# Patient Record
Sex: Female | Born: 1980 | Race: Black or African American | Hispanic: No | Marital: Single | Smoking: Never smoker
Health system: Southern US, Community
[De-identification: ages and names within clinical notes are randomized; demographics above are authoritative.]

## PROBLEM LIST (undated history)

## (undated) DIAGNOSIS — F32A Depression, unspecified: Secondary | ICD-10-CM

## (undated) DIAGNOSIS — F329 Major depressive disorder, single episode, unspecified: Secondary | ICD-10-CM

## (undated) DIAGNOSIS — F4310 Post-traumatic stress disorder, unspecified: Secondary | ICD-10-CM

## (undated) DIAGNOSIS — F419 Anxiety disorder, unspecified: Secondary | ICD-10-CM

## (undated) DIAGNOSIS — R569 Unspecified convulsions: Secondary | ICD-10-CM

## (undated) HISTORY — PX: MOUTH SURGERY: SHX715

---

## 2004-02-26 ENCOUNTER — Other Ambulatory Visit: Admission: RE | Admit: 2004-02-26 | Discharge: 2004-02-26 | Payer: Self-pay | Admitting: Family Medicine

## 2005-03-07 ENCOUNTER — Other Ambulatory Visit: Admission: RE | Admit: 2005-03-07 | Discharge: 2005-03-07 | Payer: Self-pay | Admitting: Family Medicine

## 2005-03-11 ENCOUNTER — Emergency Department (HOSPITAL_COMMUNITY): Admission: EM | Admit: 2005-03-11 | Discharge: 2005-03-11 | Payer: Self-pay | Admitting: Emergency Medicine

## 2005-03-23 ENCOUNTER — Emergency Department (HOSPITAL_COMMUNITY): Admission: EM | Admit: 2005-03-23 | Discharge: 2005-03-23 | Payer: Self-pay | Admitting: Emergency Medicine

## 2006-01-24 ENCOUNTER — Emergency Department (HOSPITAL_COMMUNITY): Admission: EM | Admit: 2006-01-24 | Discharge: 2006-01-24 | Payer: Self-pay | Admitting: Emergency Medicine

## 2006-01-29 ENCOUNTER — Emergency Department (HOSPITAL_COMMUNITY): Admission: EM | Admit: 2006-01-29 | Discharge: 2006-01-29 | Payer: Self-pay | Admitting: Emergency Medicine

## 2006-03-16 ENCOUNTER — Encounter: Payer: Self-pay | Admitting: Family Medicine

## 2006-03-16 ENCOUNTER — Other Ambulatory Visit: Admission: RE | Admit: 2006-03-16 | Discharge: 2006-03-16 | Payer: Self-pay | Admitting: Family Medicine

## 2006-03-27 ENCOUNTER — Emergency Department (HOSPITAL_COMMUNITY): Admission: EM | Admit: 2006-03-27 | Discharge: 2006-03-27 | Payer: Self-pay | Admitting: Emergency Medicine

## 2006-03-31 ENCOUNTER — Emergency Department (HOSPITAL_COMMUNITY): Admission: EM | Admit: 2006-03-31 | Discharge: 2006-03-31 | Payer: Self-pay | Admitting: Emergency Medicine

## 2006-05-07 ENCOUNTER — Ambulatory Visit: Payer: Self-pay | Admitting: Psychiatry

## 2006-05-07 ENCOUNTER — Emergency Department (HOSPITAL_COMMUNITY): Admission: EM | Admit: 2006-05-07 | Discharge: 2006-05-07 | Payer: Self-pay | Admitting: Emergency Medicine

## 2006-05-07 ENCOUNTER — Inpatient Hospital Stay (HOSPITAL_COMMUNITY): Admission: AD | Admit: 2006-05-07 | Discharge: 2006-05-10 | Payer: Self-pay | Admitting: Psychiatry

## 2006-05-11 ENCOUNTER — Encounter: Payer: Self-pay | Admitting: Family Medicine

## 2006-05-11 ENCOUNTER — Ambulatory Visit: Payer: Self-pay | Admitting: Family Medicine

## 2006-05-11 DIAGNOSIS — F319 Bipolar disorder, unspecified: Secondary | ICD-10-CM | POA: Insufficient documentation

## 2006-05-11 DIAGNOSIS — F1210 Cannabis abuse, uncomplicated: Secondary | ICD-10-CM | POA: Insufficient documentation

## 2006-05-11 DIAGNOSIS — G4700 Insomnia, unspecified: Secondary | ICD-10-CM

## 2006-05-11 DIAGNOSIS — F329 Major depressive disorder, single episode, unspecified: Secondary | ICD-10-CM | POA: Insufficient documentation

## 2006-05-11 DIAGNOSIS — G40309 Generalized idiopathic epilepsy and epileptic syndromes, not intractable, without status epilepticus: Secondary | ICD-10-CM | POA: Insufficient documentation

## 2006-05-31 ENCOUNTER — Encounter: Payer: Self-pay | Admitting: Family Medicine

## 2006-05-31 DIAGNOSIS — F1320 Sedative, hypnotic or anxiolytic dependence, uncomplicated: Secondary | ICD-10-CM | POA: Insufficient documentation

## 2006-06-01 ENCOUNTER — Encounter: Payer: Self-pay | Admitting: Family Medicine

## 2006-07-16 ENCOUNTER — Telehealth: Payer: Self-pay | Admitting: Family Medicine

## 2006-07-20 ENCOUNTER — Telehealth: Payer: Self-pay | Admitting: Family Medicine

## 2006-07-25 ENCOUNTER — Ambulatory Visit: Payer: Self-pay | Admitting: Family Medicine

## 2007-10-15 ENCOUNTER — Emergency Department (HOSPITAL_COMMUNITY): Admission: EM | Admit: 2007-10-15 | Discharge: 2007-10-15 | Payer: Self-pay | Admitting: Emergency Medicine

## 2007-10-18 ENCOUNTER — Telehealth: Payer: Self-pay | Admitting: Internal Medicine

## 2007-10-18 ENCOUNTER — Ambulatory Visit (HOSPITAL_COMMUNITY): Admission: RE | Admit: 2007-10-18 | Discharge: 2007-10-18 | Payer: Self-pay | Admitting: Emergency Medicine

## 2007-10-21 ENCOUNTER — Encounter (INDEPENDENT_AMBULATORY_CARE_PROVIDER_SITE_OTHER): Payer: Self-pay

## 2008-02-27 IMAGING — CR DG HAND COMPLETE 3+V*R*
3 series · 3 of 3 positions shown · non-contrast
Comparison: none

CLINICAL DATA: Hand injury and pain.  
 RIGHT HAND ? 3 VIEW:

[x hand pa right]
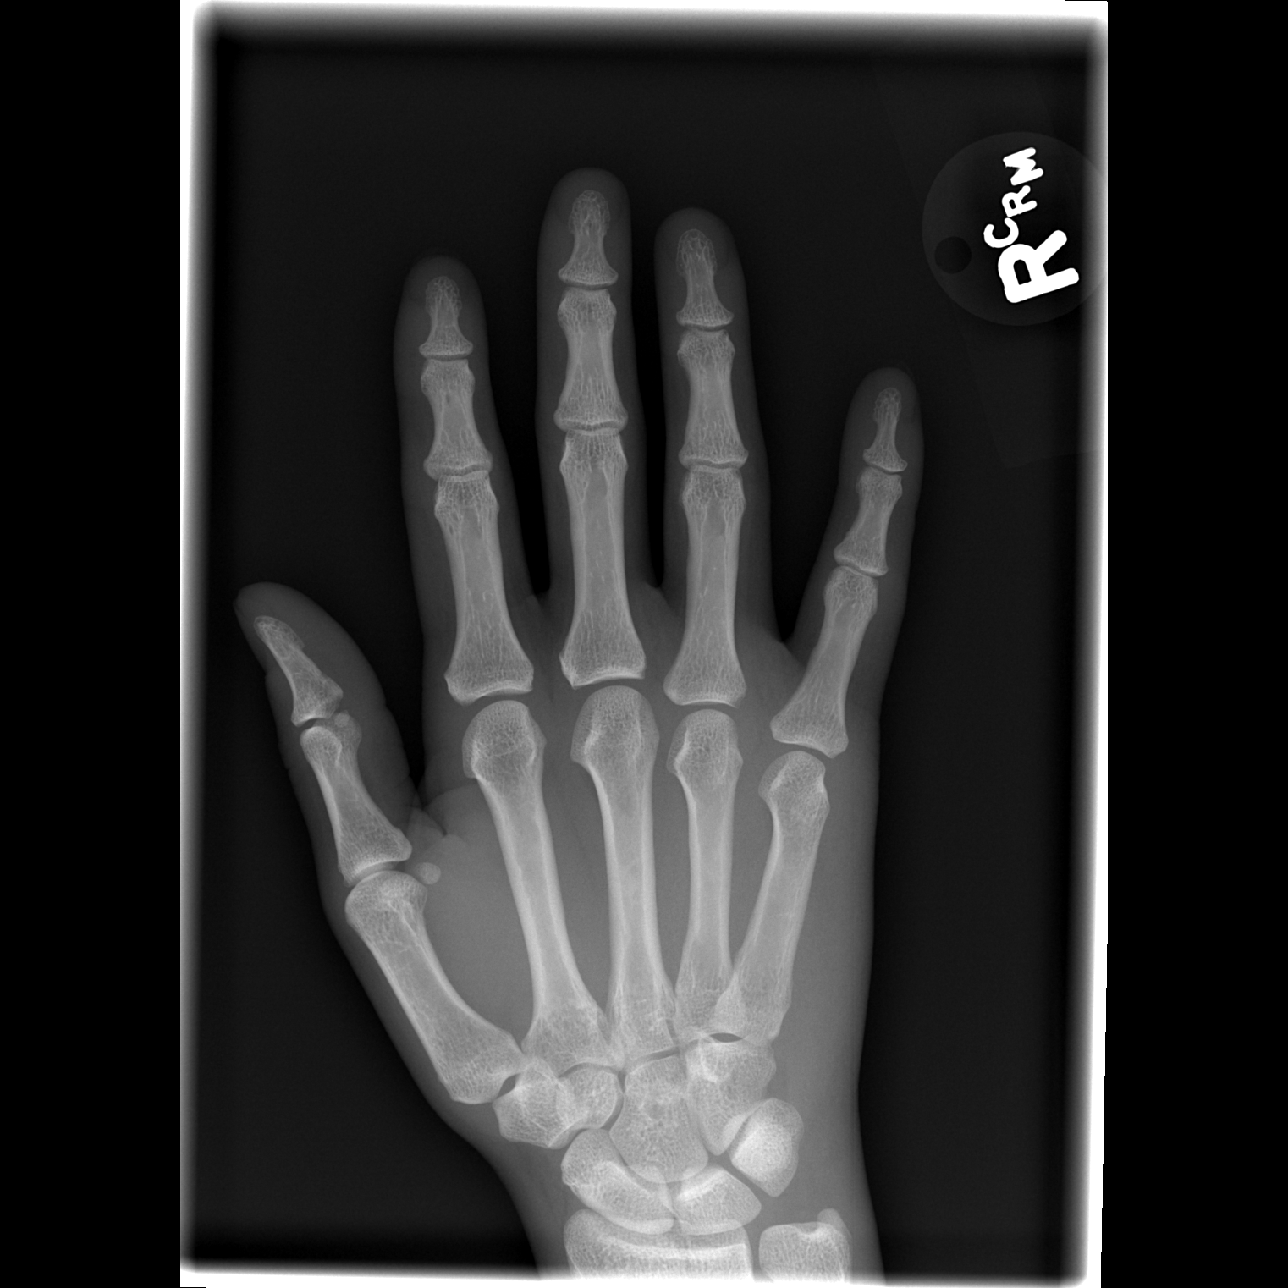

[x hand oblique right]
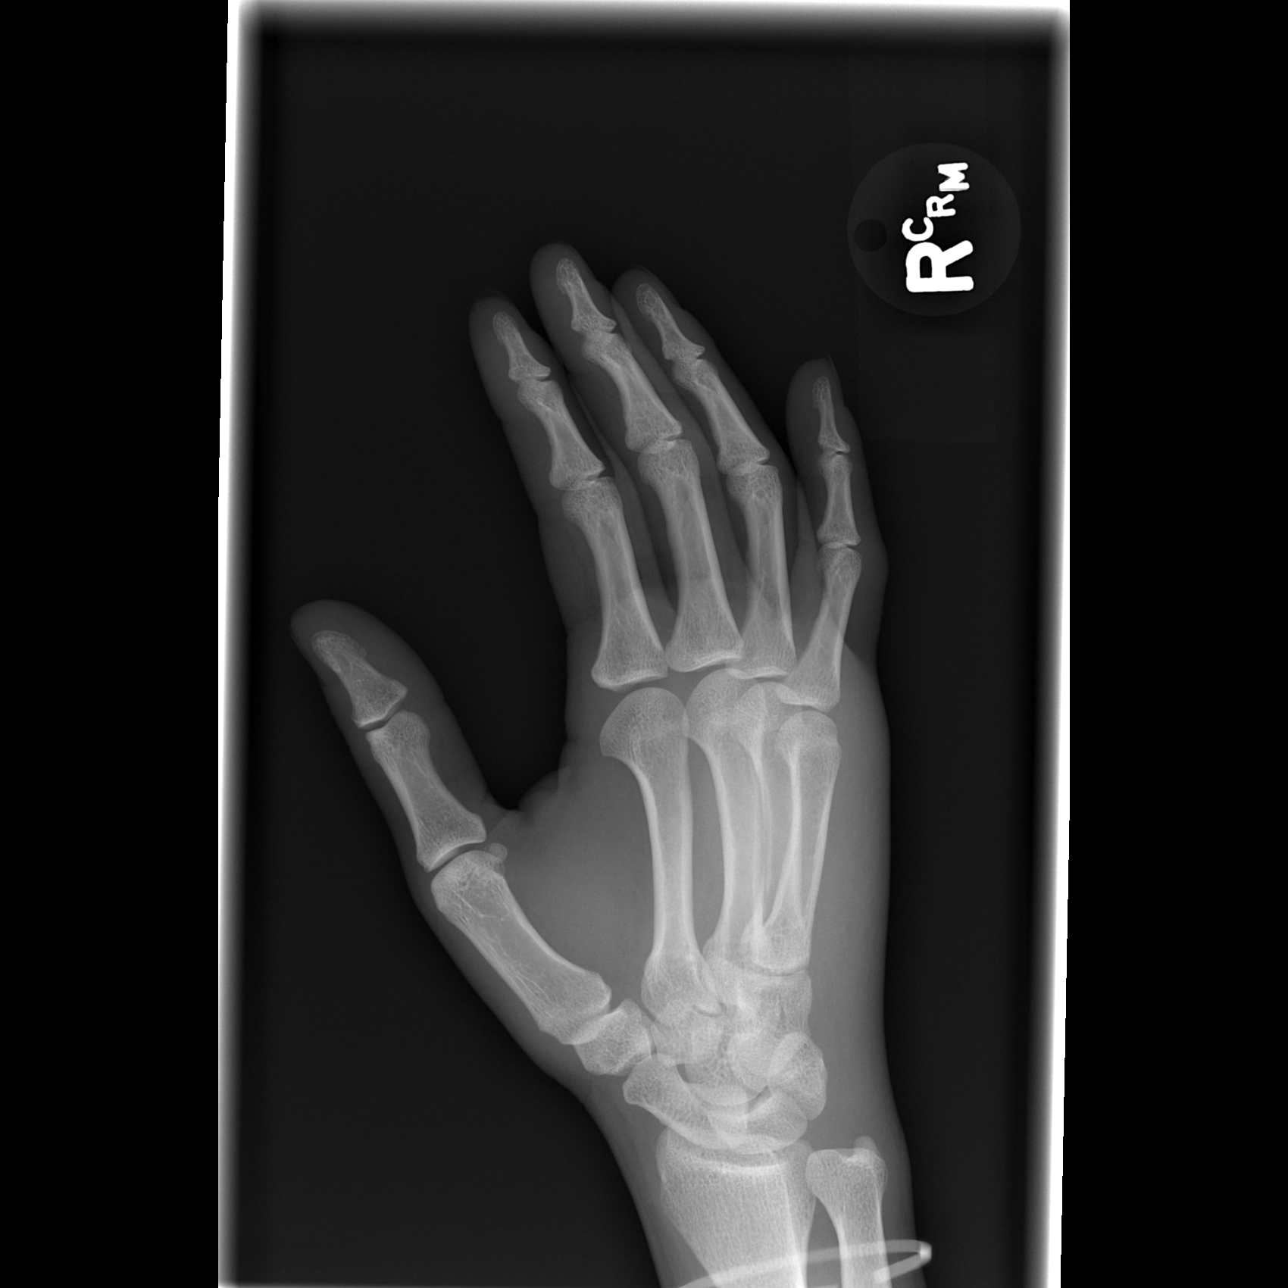

[x hand lat right]
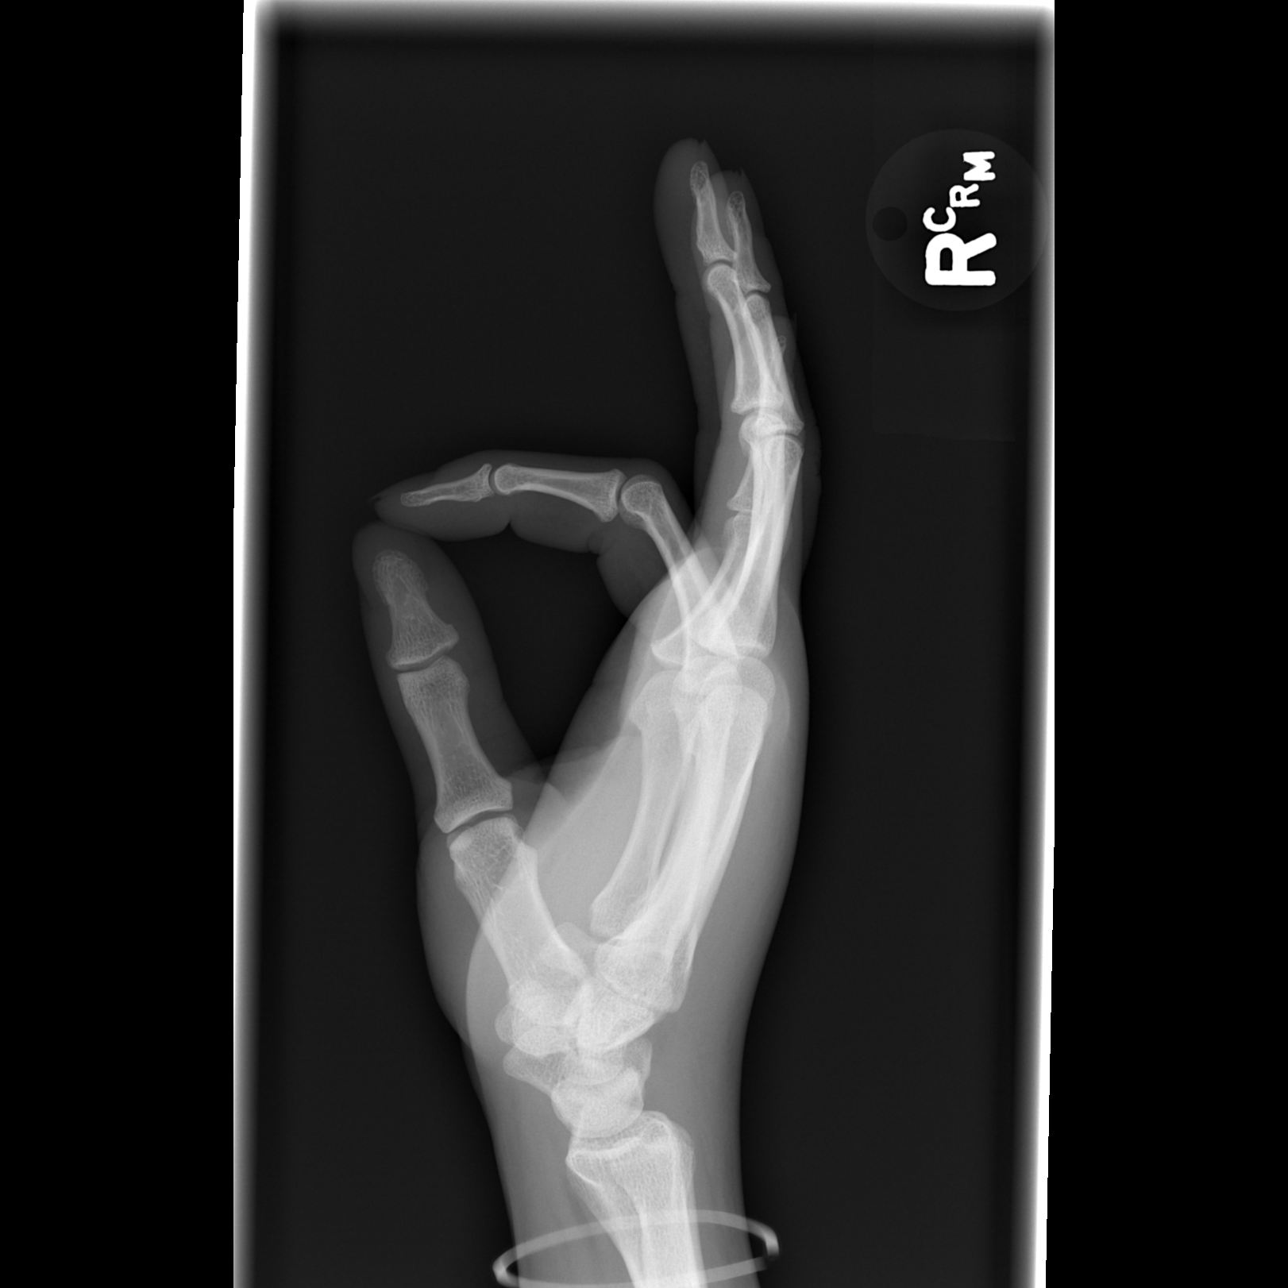

[3 of 3 positions shown; findings below may reference images not displayed]

FINDINGS: Medial/posterior soft tissue swelling is identified without evidence of acute fracture, subluxation, or dislocation.  No bony lesions are identified.
IMPRESSION: Soft tissue swelling without evidence of acute bony or joint abnormality.

## 2008-04-29 IMAGING — CT CT HEAD W/O CM
1 series · 16 of 30 positions shown, 20 images · non-contrast
Comparison: none

CLINICAL DATA: Headache, nausea

[Series 2: head routine 4.8 h47s · axial · 0.44mm/px · z∈[-180,-51]mm · 16 of 30 slices shown, 20 images]
[im 2/30  brain]
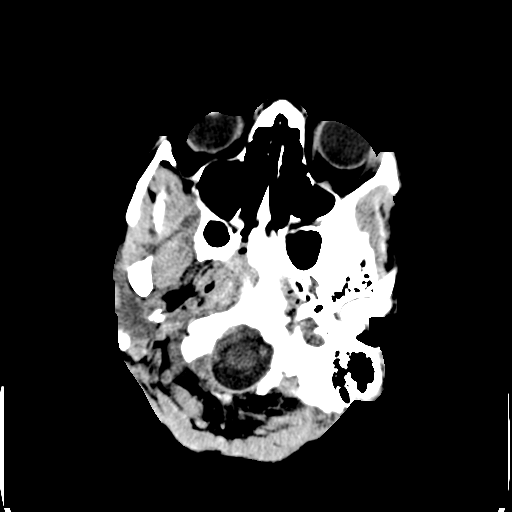
[im 2/30  bone]
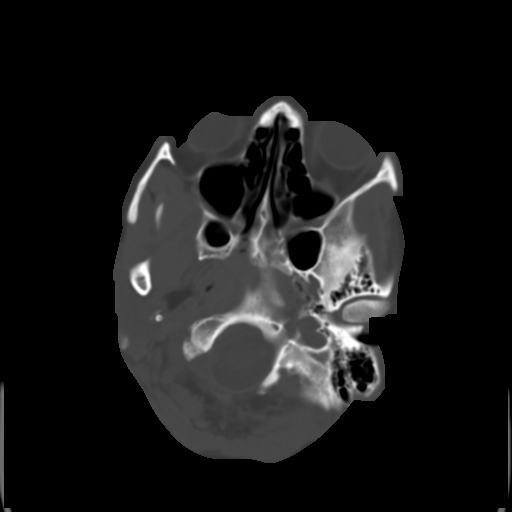
[im 4/30  brain]
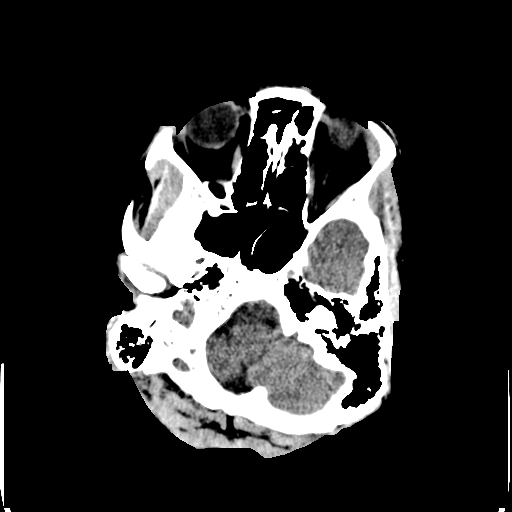
[im 6/30  brain]
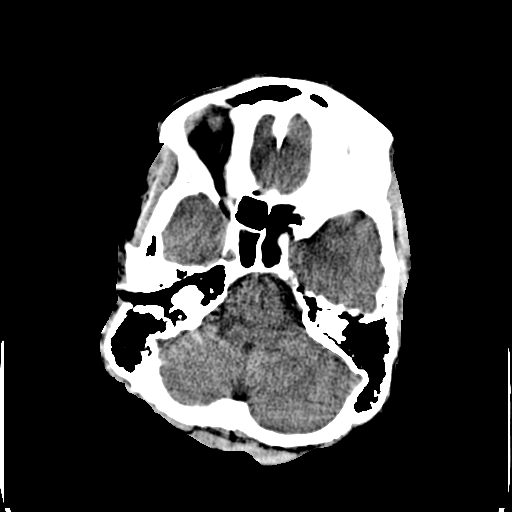
[im 8/30  brain]
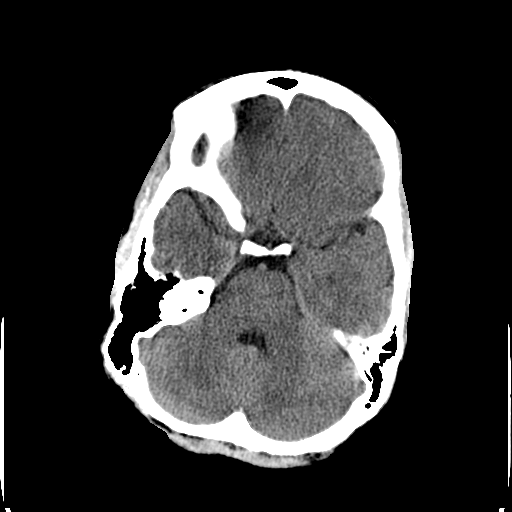
[im 9/30  brain]
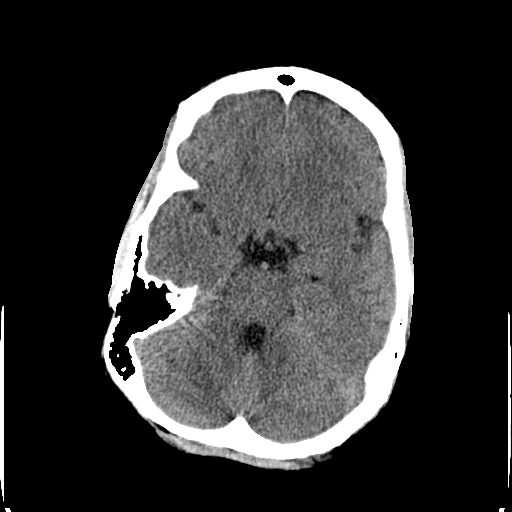
[im 9/30  bone]
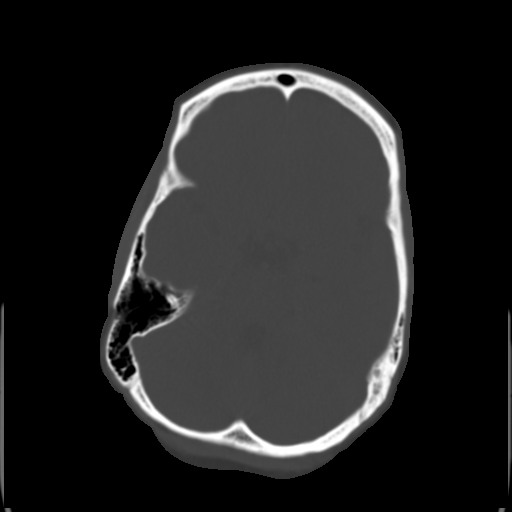
[im 11/30  brain]
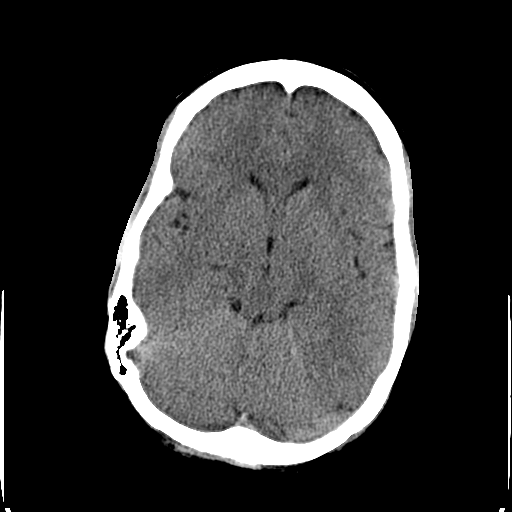
[im 13/30  brain]
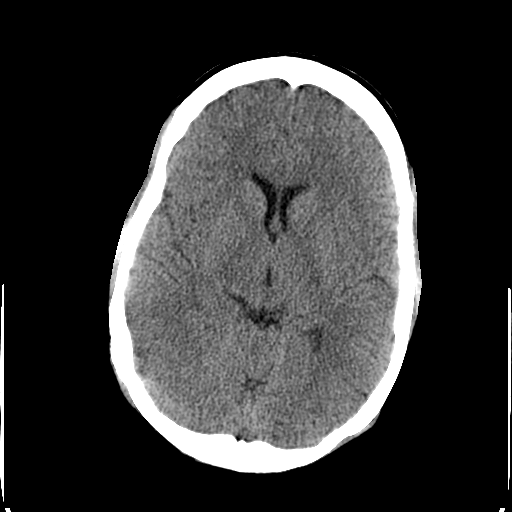
[im 15/30  brain]
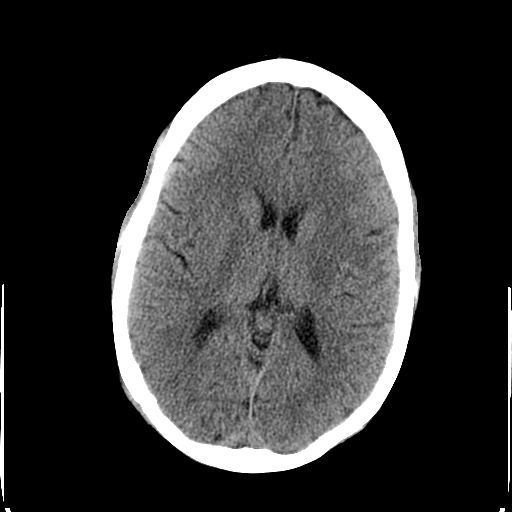
[im 16/30  brain]
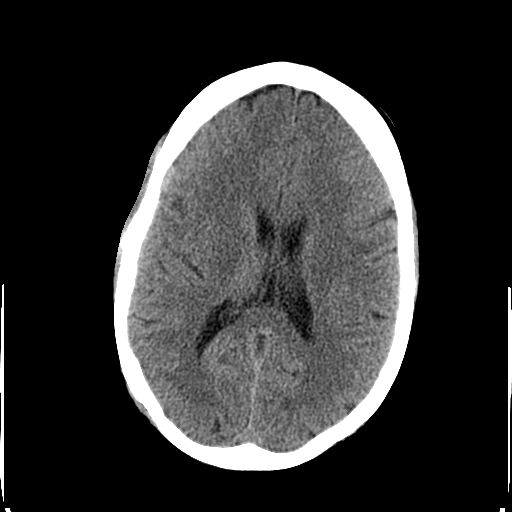
[im 16/30  bone]
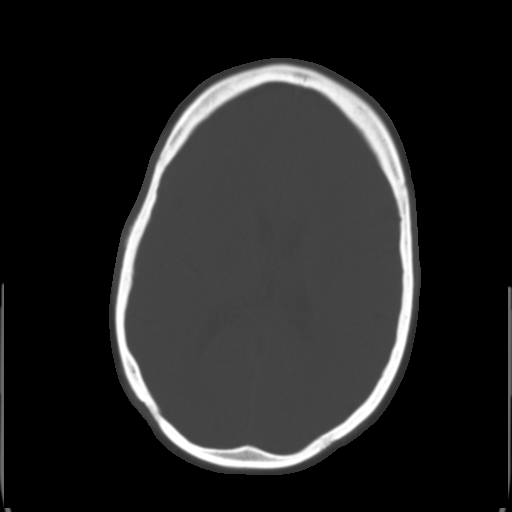
[im 18/30  brain]
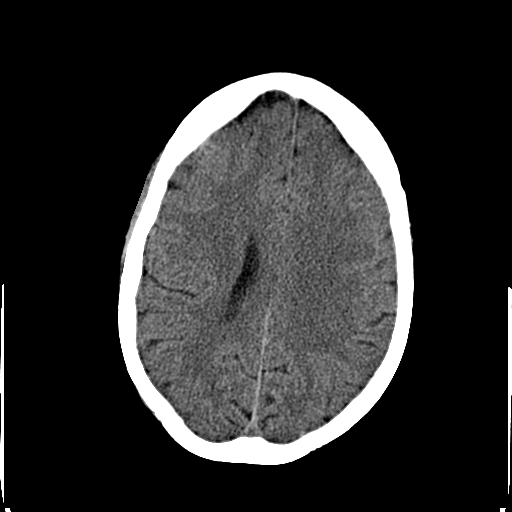
[im 20/30  brain]
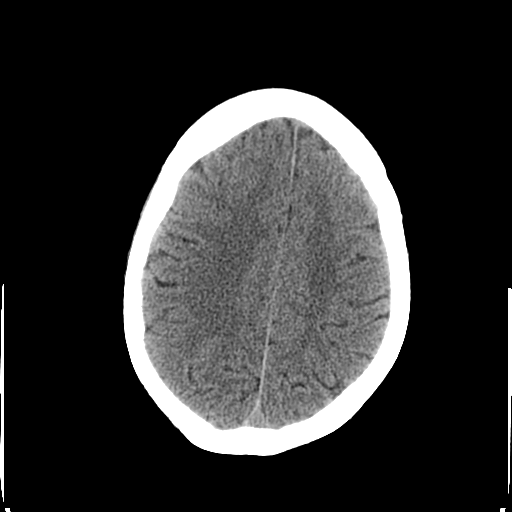
[im 22/30  brain]
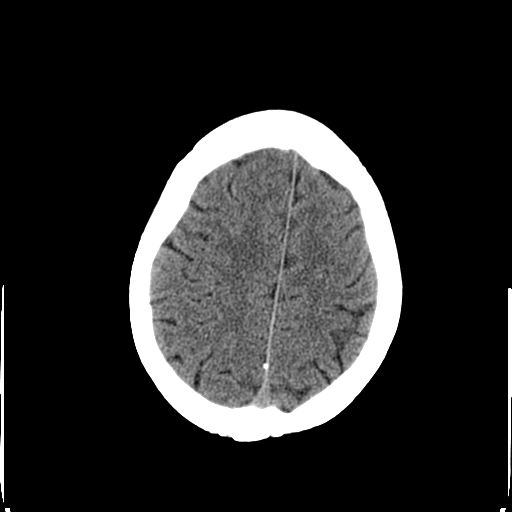
[im 23/30  brain]
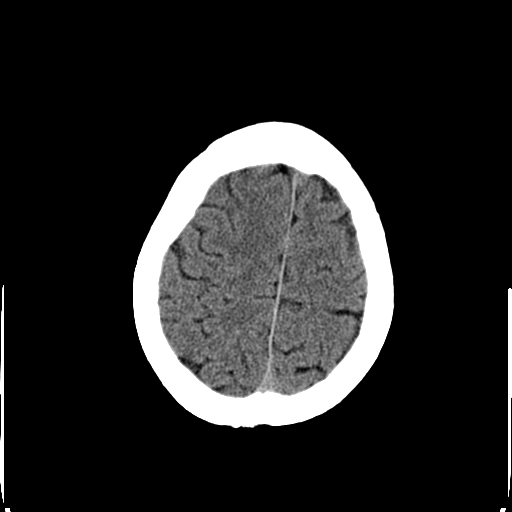
[im 23/30  bone]
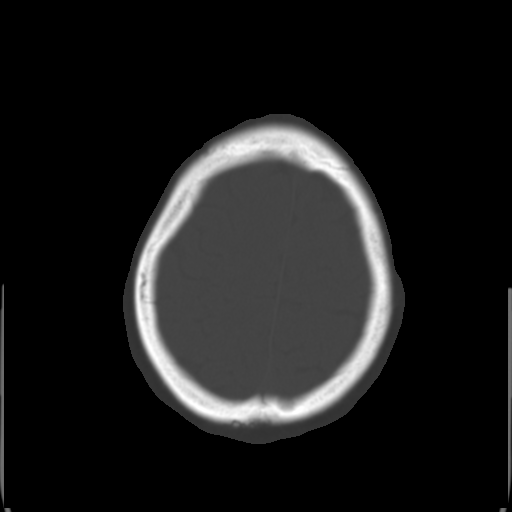
[im 25/30  brain]
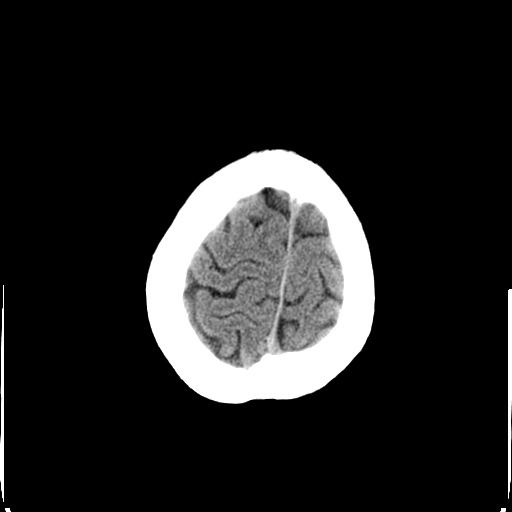
[im 27/30  brain]
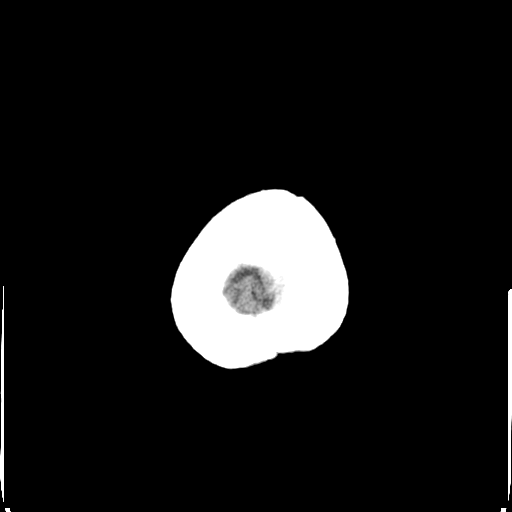
[im 29/30  brain]
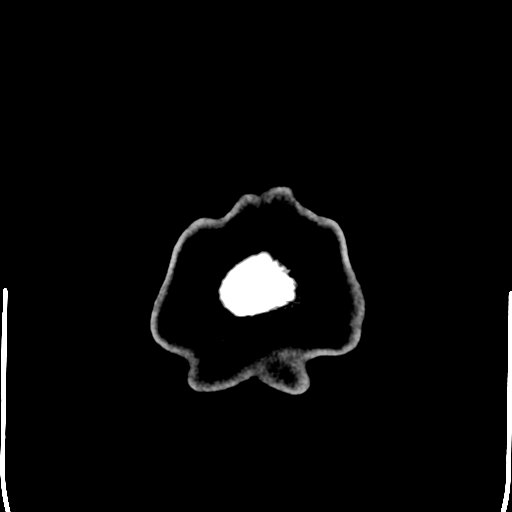

[16 of 30 positions shown; findings below may reference images not displayed]

CT head without contrast:

No previous for comparison.  There is no evidence of acute intracranial
hemorrhage, brain edema, mass,  mass effect, or midline shift. Acute infarct may
be inapparent on noncontrast CT.  No other intra-axial abnormalities are seen,
and the ventricles and sulci are within normal limits in size and symmetry.   No
abnormal extra-axial fluid collections or masses are identified.  No significant
calvarial abnormality.
IMPRESSION: 1. Negative non-contrast head CT.

## 2009-01-17 ENCOUNTER — Emergency Department (HOSPITAL_COMMUNITY): Admission: EM | Admit: 2009-01-17 | Discharge: 2009-01-17 | Payer: Self-pay | Admitting: Emergency Medicine

## 2009-01-18 ENCOUNTER — Emergency Department: Payer: Self-pay | Admitting: Emergency Medicine

## 2010-04-17 ENCOUNTER — Inpatient Hospital Stay: Payer: Self-pay | Admitting: Psychiatry

## 2010-06-14 NOTE — Assessment & Plan Note (Signed)
[facility]                                 ON-CALL NOTE   NAME:SINGLETONViolette, Morneault                     MRN:          [id number]  DATE:07/19/2006                            DOB:          [birth year]    PRIMARY CARE PHYSICIAN:  Dr. Pattricia Boss   Ms. Johannesen called in today, stating that she wanted to get a refill  on her sleeping medication.  She reports that she has called the office,  but has not received a response as of yet.  I advised patient that we do  not refill medications after hours and advised her to call tomorrow  morning and leave a message.  She did report that Dr. Pattricia Boss is on  maternity leave.  I did advise patient that there is always a physician  that cross-covers for doctors that are out of the office and that should  not be an issue.  Patient expressed understanding and will call the  office tomorrow.     Leanne Chang, M.D.  Electronically Signed    LA/MedQ  DD: 07/19/2006  DT: 07/20/2006  Job #: [id number]

## 2010-06-17 NOTE — Discharge Summary (Signed)
NAMEBASILIA, Novak              ACCOUNT NO.:  [id number]   MEDICAL RECORD NO.:  [id number]          PATIENT TYPE:  IPS   LOCATION:  0304                          FACILITY:  [facility]   PHYSICIAN:  Anselm Jungling, MD  DATE OF BIRTH:  August 15, 1980   DATE OF ADMISSION:  05/07/2006  DATE OF DISCHARGE:  05/10/2006                               DISCHARGE SUMMARY   IDENTIFYING DATA/REASON FOR ADMISSION:  This was an inpatient  psychiatric admission for Tonya Novak, a 30 year old female admitted due to  increasing depression, self-inflicted cutting, and suicidal ideation.  She is a patient of Dr. Quintella Reichert.  She had been recently given a trial of  Lamictal, but this was discontinued due to side effects, apparently not  rash.  She had also been taking Ambien, temazepam, and Zoloft.  She had  a history of multiple prior suicide attempts.  At the time of admission,  there was a question of alcohol abuse.  Apparently, she had been  diagnosed in the past with bipolar disorder.  She came to [place] on lithium,  which she stated had been started four days prior to admission.  Please  refer to the admission note for further details pertaining to the  symptoms, circumstances and history that led to her hospitalization.   INITIAL DIAGNOSTIC IMPRESSION:  She was given an initial AXIS I  diagnoses of bipolar disorder not otherwise specified by history,  currently depressed, rule out alcohol abuse, and rule out cluster B  personality traits.   MEDICAL/LABORATORY:  The patient came to [place] in good health without any  active or chronic medical problems.  She was medically and physically  assessed by the psychiatric nurse practitioner upon admission.   HOSPITAL COURSE:  The patient was admitted to the adult inpatient  psychiatric service.  She presented as a well-nourished, well-developed  young woman who was alert, fully oriented, pleasant and cooperative.  Her mood appeared neutral.  She denied suicidal ideation in the  initial  interview.  She denied that she had made any suicide attempt prior to  admission.  She indicated that she mostly wanted to be linked up with an  outpatient therapist.   The patient was initially continued on the medications that she had come  to [place] on, including Zoloft, but she requested a discontinuation of  Zoloft on the second hospital day, feeling that she was not doing well  on it.  We discussed the possibility of consideration of new medication  trials, but she decided against this.   She participated in various therapeutic groups and activities, and was a  good participant in the treatment program.  Sleep and appetite were  stable during her stay.   On the third hospital day, there was a family session involving Jasimine and  her parents.  The patient reported to her parents that coming into the  inpatient program had helped her, by helping her feel more connected to  people with issues similar to her own, namely depression.  She was able  to discuss with her parents various issues that she had been concerned  about.  They discussed aftercare needs.  The patient's parents were  quite supportive.   On the fourth hospital day, the patient stated that she felt a lot  better and reported that the meeting with her parents had been very  positive.  Sleep and appetite remained stable.  She remained free of  suicidal ideation, more hopeful, and optimistic.  She indicated that  thus far she felt fine without any psychotropic medication and again  stated that she did not want to begin any new trials.   The patient was discharged that same day.  She agreed to the following  aftercare plan.   AFTERCARE:  The patient was to see a counselor for an intake appointment  on May 15, 2006 at 9 a.m., and at 4 p.m. on the same day, an  appointment with Dr. Quintella Reichert, psychiatrist.   DISCHARGE MEDICATIONS:  None.   DISCHARGE DIAGNOSES:  AXIS I:  Depressive disorder not otherwise   specified.  Rule out bipolar disorder, type 2.  AXIS II:  Deferred.  AXIS III:  No acute or chronic illnesses.  AXIS IV:  Stressors:  Severe.  AXIS V:  GAF on discharge 65.      Anselm Jungling, MD  Electronically Signed     SPB/MEDQ  D:  05/23/2006  T:  05/23/2006  Job:  [id number]

## 2010-06-17 NOTE — Assessment & Plan Note (Signed)
[facility]                                 ON-CALL NOTE   NAME:Anton, [facility]                       MRN:          [id number]  DATE:05/18/2006                            DOB:          1980/11/12    Phone number [phone].   SUBJECTIVE:  This is a new patient of mine who is calling in requesting  a sleep medication.  She has history of bipolar disorder and insomnia.  She states she was placed back on lithium this week by her psychiatrist,  but she would like to get medications for sleep through me.   ASSESSMENT AND PLAN:  I did discuss that I agree with getting controlled  substance through 1 physician.  I told her I would call the pharmacy to  determine when last refills were done.  Once doing this, I found that  she had prescription for May 11, 2006 for 15 Ambien.  She should not  be out of this medication, and has a warning on her file at the pharmacy  about early refills.  I then called the patient back and told her that  for further medication she would need to wait for verification through  my office from her psychiatrist, as well from her previous doctor.  No  medication refills were given this evening.     Kerby Nora, MD  Electronically Signed    AB/MedQ  DD: 05/19/2006  DT: 05/19/2006  Job #: [id number]

## 2010-08-07 ENCOUNTER — Emergency Department: Payer: Self-pay | Admitting: Emergency Medicine

## 2010-10-16 ENCOUNTER — Emergency Department: Payer: Self-pay | Admitting: Emergency Medicine

## 2010-10-16 ENCOUNTER — Emergency Department: Payer: Self-pay | Admitting: *Deleted

## 2010-10-31 LAB — CBC
HCT: 37.8
Platelets: 214
RDW: 16.8 — ABNORMAL HIGH
WBC: 4.1

## 2010-10-31 LAB — URINALYSIS, ROUTINE W REFLEX MICROSCOPIC
Glucose, UA: NEGATIVE
Ketones, ur: NEGATIVE
Nitrite: NEGATIVE
Specific Gravity, Urine: 1.023
pH: 7

## 2010-10-31 LAB — POCT PREGNANCY, URINE: Preg Test, Ur: NEGATIVE

## 2010-10-31 LAB — COMPREHENSIVE METABOLIC PANEL
AST: 14
Albumin: 4
Alkaline Phosphatase: 60
BUN: 9
GFR calc Af Amer: >60
Potassium: 4.1
Total Protein: 7.6

## 2010-10-31 LAB — DIFFERENTIAL
Basophils Relative: 1
Eosinophils Relative: 2
Lymphocytes Relative: 54 — ABNORMAL HIGH
Monocytes Absolute: 0.2
Monocytes Relative: 5
Neutro Abs: 1.6 — ABNORMAL LOW

## 2010-10-31 LAB — URINE MICROSCOPIC-ADD ON

## 2015-08-23 ENCOUNTER — Encounter: Payer: Self-pay | Admitting: Emergency Medicine

## 2015-08-23 ENCOUNTER — Emergency Department
Admission: EM | Admit: 2015-08-23 | Discharge: 2015-08-23 | Disposition: A | Discharge status: Home / Self Care | Payer: Self-pay | Attending: Emergency Medicine | Admitting: Emergency Medicine

## 2015-08-23 DIAGNOSIS — E860 Dehydration: Secondary | ICD-10-CM | POA: Insufficient documentation

## 2015-08-23 DIAGNOSIS — R55 Syncope and collapse: Secondary | ICD-10-CM

## 2015-08-23 HISTORY — DX: Post-traumatic stress disorder, unspecified: F43.10

## 2015-08-23 HISTORY — DX: Anxiety disorder, unspecified: F41.9

## 2015-08-23 HISTORY — DX: Unspecified convulsions: R56.9

## 2015-08-23 HISTORY — DX: Major depressive disorder, single episode, unspecified: F32.9

## 2015-08-23 HISTORY — DX: Depression, unspecified: F32.A

## 2015-08-23 LAB — URINALYSIS COMPLETE WITH MICROSCOPIC (ARMC ONLY)
BILIRUBIN URINE: NEGATIVE
Bacteria, UA: NONE SEEN
GLUCOSE, UA: NEGATIVE mg/dL
Ketones, ur: NEGATIVE mg/dL
Leukocytes, UA: NEGATIVE
Nitrite: NEGATIVE
Protein, ur: NEGATIVE mg/dL
RBC / HPF: NONE SEEN RBC/hpf (ref 0–5)
Specific Gravity, Urine: 1.01 (ref 1.005–1.030)
pH: 5 (ref 5.0–8.0)

## 2015-08-23 LAB — CBC
HCT: 35.8 % (ref 35.0–47.0)
Hemoglobin: 12.4 g/dL (ref 12.0–16.0)
MCH: 30.6 pg (ref 26.0–34.0)
MCHC: 34.6 g/dL (ref 32.0–36.0)
MCV: 88.3 fL (ref 80.0–100.0)
PLATELETS: 256 10*3/uL (ref 150–440)
RBC: 4.05 MIL/uL (ref 3.80–5.20)
RDW: 15 % — AB (ref 11.5–14.5)
WBC: 8 10*3/uL (ref 3.6–11.0)

## 2015-08-23 LAB — TROPONIN I

## 2015-08-23 LAB — BASIC METABOLIC PANEL
Anion gap: 6 (ref 5–15)
BUN: 12 mg/dL (ref 6–20)
CALCIUM: 9.3 mg/dL (ref 8.9–10.3)
CO2: 25 mmol/L (ref 22–32)
CREATININE: 0.89 mg/dL (ref 0.44–1.00)
Chloride: 109 mmol/L (ref 101–111)
GFR calc non Af Amer: >60 mL/min (ref >60)
Glucose, Bld: 90 mg/dL (ref 65–99)
Potassium: 3.2 mmol/L — ABNORMAL LOW (ref 3.5–5.1)
SODIUM: 140 mmol/L (ref 135–145)

## 2015-08-23 LAB — POCT PREGNANCY, URINE: PREG TEST UR: NEGATIVE

## 2015-08-23 MED ORDER — SODIUM CHLORIDE 0.9 % IV BOLUS (SEPSIS)
1000.0000 mL | Freq: Once | INTRAVENOUS | Status: AC
  Administered 2015-08-23: 1000 mL via INTRAVENOUS
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
## 2015-08-23 NOTE — ED Notes (Signed)
E signature pad not working. Pt verbalized consent and agreed to find follow up care if needed. This RN supplied list of clinics in area for pt's convenience.

## 2015-08-23 NOTE — ED Triage Notes (Signed)
Patient states that she felt lightheaded and felt like she was going to pass out. Patient diaphoretic and states that she is feeling nauseated.

## 2015-12-03 ENCOUNTER — Encounter: Payer: Self-pay | Admitting: Emergency Medicine

## 2015-12-03 ENCOUNTER — Emergency Department
Admission: EM | Admit: 2015-12-03 | Discharge: 2015-12-03 | Disposition: A | Discharge status: Home / Self Care | Payer: BLUE CROSS/BLUE SHIELD | Attending: Emergency Medicine | Admitting: Emergency Medicine

## 2015-12-03 DIAGNOSIS — N939 Abnormal uterine and vaginal bleeding, unspecified: Secondary | ICD-10-CM | POA: Insufficient documentation

## 2015-12-03 LAB — COMPREHENSIVE METABOLIC PANEL
ALK PHOS: 56 U/L (ref 38–126)
ALT: 15 U/L (ref 14–54)
AST: 23 U/L (ref 15–41)
Albumin: 4.2 g/dL (ref 3.5–5.0)
Anion gap: 8 (ref 5–15)
BUN: 12 mg/dL (ref 6–20)
CALCIUM: 9.1 mg/dL (ref 8.9–10.3)
CHLORIDE: 109 mmol/L (ref 101–111)
CO2: 22 mmol/L (ref 22–32)
CREATININE: 0.68 mg/dL (ref 0.44–1.00)
GFR calc Af Amer: >60 mL/min (ref >60)
GFR calc non Af Amer: >60 mL/min (ref >60)
Glucose, Bld: 95 mg/dL (ref 65–99)
Potassium: 3.4 mmol/L — ABNORMAL LOW (ref 3.5–5.1)
SODIUM: 139 mmol/L (ref 135–145)
Total Bilirubin: 0.6 mg/dL (ref 0.3–1.2)
Total Protein: 7.8 g/dL (ref 6.5–8.1)

## 2015-12-03 LAB — URINALYSIS COMPLETE WITH MICROSCOPIC (ARMC ONLY)
Bilirubin Urine: NEGATIVE
Glucose, UA: NEGATIVE mg/dL
Leukocytes, UA: NEGATIVE
Nitrite: NEGATIVE
PROTEIN: NEGATIVE mg/dL
Specific Gravity, Urine: 1.024 (ref 1.005–1.030)
pH: 6 (ref 5.0–8.0)

## 2015-12-03 LAB — CBC WITH DIFFERENTIAL/PLATELET
BASOS ABS: 0 10*3/uL (ref 0–0.1)
Basophils Relative: 0 %
EOS ABS: 0.2 10*3/uL (ref 0–0.7)
EOS PCT: 4 %
HCT: 37.2 % (ref 35.0–47.0)
HEMOGLOBIN: 12.6 g/dL (ref 12.0–16.0)
LYMPHS ABS: 2.5 10*3/uL (ref 1.0–3.6)
LYMPHS PCT: 55 %
MCH: 30.3 pg (ref 26.0–34.0)
MCHC: 34 g/dL (ref 32.0–36.0)
MCV: 89 fL (ref 80.0–100.0)
Monocytes Absolute: 0.3 10*3/uL (ref 0.2–0.9)
Monocytes Relative: 7 %
NEUTROS PCT: 34 %
Neutro Abs: 1.5 10*3/uL (ref 1.4–6.5)
PLATELETS: 203 10*3/uL (ref 150–440)
RBC: 4.18 MIL/uL (ref 3.80–5.20)
RDW: 15.1 % — ABNORMAL HIGH (ref 11.5–14.5)
WBC: 4.5 10*3/uL (ref 3.6–11.0)

## 2015-12-03 LAB — CHLAMYDIA/NGC RT PCR (ARMC ONLY)
CHLAMYDIA TR: NOT DETECTED
N GONORRHOEAE: NOT DETECTED

## 2015-12-03 LAB — POCT PREGNANCY, URINE: PREG TEST UR: NEGATIVE

## 2015-12-03 NOTE — Discharge Instructions (Signed)
You were evaluated for irregular vaginal bleeding, and your exam and evaluation are reassuring in the emergency department today. As you discuss, you may need additional testing including possible endometrial biopsy, and I do want you to be seen by an OB/GYN.  Return to the emergency room for any worsening heavy bleeding with dizziness or passing out, or saturating more than one pad an hour for a few hours in a row, or any other symptoms that are concerning to you. Certainly return if you're having any new or worsening abdominal pain.

## 2015-12-03 NOTE — ED Triage Notes (Signed)
Reports vaginal bleeding x 2.5 wks, states this is abnormal for her.

## 2015-12-03 NOTE — ED Provider Notes (Signed)
[facility] Emergency Department Provider Note ____________________________________________   I have reviewed the triage vital signs and the triage nursing note.  HISTORY  Chief Complaint Vaginal Bleeding   Historian Patient  HPI Tonya Novak is a 35 y.o. female with history of anxiety, depression and posttraumatic stress disorder, presents today for complaint of 2-1/2 weeks of persistent vaginal bleeding considered moderate and similar to that. But abnormal in terms of length. She states that she typically has normal periods, and then had sex with her boyfriend during the period of fertility and became worried during that period of time and took Plan B. Since she took the plan B she has had vaginal bleeding since then. She also feels like her moods are a little more unstable in terms of crying and anxiety, but she has a therapist and feels no worsening depression or suicidal thoughts.  No dysuria. No fevers.    Past Medical History:  Diagnosis Date  . Anxiety   . Depression   . PTSD (post-traumatic stress disorder)   . Seizure [facility])     Patient Active Problem List   Diagnosis Date Noted  . 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 DISORDER, BIPOLAR NOS 05/11/2006  . MARIJUANA ABUSE 05/11/2006  . INSOMNIA, CHRONIC 05/11/2006  . DEPRESSION 05/11/2006  . 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 MOUTH SURGERY      Prior to Admission medications   Not on File    Allergies  Allergen Reactions  . Lamotrigine   . Pseudoephedrine     No family history on file.  Social History Social History  Substance Use Topics  . Smoking status: Never Smoker  . Smokeless tobacco: Never Used  . Alcohol use Yes    Review of Systems  Constitutional: Negative for fever. Eyes: Negative for visual changes. ENT: Negative for sore throat. Cardiovascular: Negative for chest pain. Respiratory: Negative  for shortness of breath. Gastrointestinal: Negative for vomiting and diarrhea.  Mild occasional abdominal cramping. Genitourinary: Negative for dysuria. Musculoskeletal: Negative for back pain. Skin: Negative for rash. Neurological: Negative for headache. 10 point Review of Systems otherwise negative ____________________________________________   PHYSICAL EXAM:  VITAL SIGNS: ED Triage Vitals [12/03/15 1204]  Enc Vitals Group     BP 137/85     Pulse Rate 99     Resp 18     Temp 98.6 F (37 C)     Temp Source Oral     SpO2 100 %     Weight 150 lb (68 kg)     Height 5\' 3"  (1.6 m)     Head Circumference      Peak Flow      Pain Score 6     Pain Loc      Pain Edu?      Excl. in GC?      Constitutional: Alert and oriented. Well appearing and in no distress. HEENT   Head: Normocephalic and atraumatic.      Eyes: Conjunctivae are normal. PERRL. Normal extraocular movements.      Ears:         Nose: No congestion/rhinnorhea.   Mouth/Throat: Mucous membranes are moist.   Neck: No stridor. Cardiovascular/Chest: Normal rate, regular rhythm.  No murmurs, rubs, or gallops. Respiratory: Normal respiratory effort without tachypnea nor retractions. Breath sounds are clear and equal bilaterally. No wheezes/rales/rhonchi. Gastrointestinal: Soft. No distention, no guarding, no rebound. Nontender.    Genitourinary/rectal:Small amount of  vaginal bleeding from close cervical os. No vaginal discharge. Non-tender cervix on bimanual pelvic exam. Musculoskeletal: Nontender with normal range of motion in all extremities. No joint effusions.  No lower extremity tenderness.  No edema. Neurologic:  Normal speech and language. No gross or focal neurologic deficits are appreciated. Skin:  Skin is warm, dry and intact. No rash noted. Psychiatric: A little anxious and at times tearful, but interactive and bright also. Normal insight and  judgment.   ____________________________________________  LABS (pertinent positives/negatives)  Labs Reviewed  CBC WITH DIFFERENTIAL/PLATELET - Abnormal; Notable for the following:       Result Value   RDW 15.1 (*)    All other components within normal limits  COMPREHENSIVE METABOLIC PANEL - Abnormal; Notable for the following:    Potassium 3.4 (*)    All other components within normal limits  URINALYSIS COMPLETEWITH MICROSCOPIC ([facility] ONLY) - Abnormal; Notable for the following:    Color, Urine YELLOW (*)    APPearance CLEAR (*)    Ketones, ur 1+ (*)    Hgb urine dipstick 3+ (*)    Bacteria, UA RARE (*)    Squamous Epithelial / LPF 0-5 (*)    All other components within normal limits  CHLAMYDIA/NGC RT PCR ([facility] ONLY)  POC URINE PREG, ED  POCT PREGNANCY, URINE    ____________________________________________    EKG I, Governor Rooksebecca Tafari Humiston, MD, the attending physician have personally viewed and interpreted all ECGs.  None ____________________________________________  RADIOLOGY All Xrays were viewed by me. Imaging interpreted by Radiologist.  None __________________________________________  PROCEDURES  Procedure(s) performed: None  Critical Care performed: None  ____________________________________________   ED COURSE / ASSESSMENT AND PLAN  Pertinent labs & imaging results that were available during my care of the patient were reviewed by me and considered in my medical decision making (see chart for details).   Tonya Novak is here for vaginal bleeding which is longer in duration than typical for her, consistent with irregular vaginal bleeding.  Physical exam is reassuring. I am going to recommend her follow-up with OB/GYN for irregular vaginal bleeding, consideration of hormonal control.   I initially had placed orders for urine culture and STD testing, however given the patient has no evidence of cervicitis, nor symptoms of dysuria, I am not going to  pursue these additional tests.  CONSULTATIONS:  None  Patient / Family / Caregiver informed of clinical course, medical decision-making process, and agree with plan.   I discussed return precautions, follow-up instructions, and discharge instructions with patient and/or family.   ___________________________________________   FINAL CLINICAL IMPRESSION(S) / ED DIAGNOSES   Final diagnoses:  Abnormal uterine bleeding              Note: This dictation was prepared with Dragon dictation. Any transcriptional errors that result from this process are unintentional    Governor Rooksebecca Ricquel Foulk, MD 12/03/15 [phone]

## 2017-03-29 ENCOUNTER — Encounter: Payer: Self-pay | Admitting: Emergency Medicine

## 2017-03-29 ENCOUNTER — Emergency Department
Admission: EM | Admit: 2017-03-29 | Discharge: 2017-03-29 | Disposition: A | Discharge status: Home / Self Care | Payer: BLUE CROSS/BLUE SHIELD | Attending: Emergency Medicine | Admitting: Emergency Medicine

## 2017-03-29 ENCOUNTER — Other Ambulatory Visit: Payer: Self-pay

## 2017-03-29 DIAGNOSIS — B349 Viral infection, unspecified: Secondary | ICD-10-CM

## 2017-03-29 DIAGNOSIS — J029 Acute pharyngitis, unspecified: Secondary | ICD-10-CM | POA: Insufficient documentation

## 2017-03-29 LAB — GROUP A STREP BY PCR: Group A Strep by PCR: NOT DETECTED

## 2017-03-29 MED ORDER — LIDOCAINE VISCOUS 2 % MT SOLN: 5.0000 mL | Freq: Four times a day (QID) | OROMUCOSAL | 0 refills | Status: DC | PRN

## 2017-03-29 MED ORDER — ONDANSETRON 8 MG PO TBDP
8.0000 mg | ORAL_TABLET | Freq: Once | ORAL | Status: AC
  Administered 2017-03-29: 8 mg via ORAL
  Filled 2017-03-29: qty 1

## 2017-03-29 MED ORDER — IBUPROFEN 600 MG PO TABS: 600.0000 mg | ORAL_TABLET | Freq: Three times a day (TID) | ORAL | 0 refills | Status: DC | PRN

## 2017-03-29 MED ORDER — PROMETHAZINE-DM 6.25-15 MG/5ML PO SYRP: 5.0000 mL | ORAL_SOLUTION | Freq: Four times a day (QID) | ORAL | 0 refills | Status: DC | PRN

## 2017-03-29 NOTE — ED Notes (Signed)
Pt reports sore throat x 7 days and fevers, not taking anything like ibuprofen or tylenol states she doesn't like the way it makes her feel. Pt states she is using [employer] otc for sxs and tea with minimal relief.

## 2017-03-29 NOTE — ED Provider Notes (Signed)
[facility] Emergency Department Provider Note   ____________________________________________   None    (approximate)  I have reviewed the triage vital signs and the nursing notes.   HISTORY  Chief Complaint Sore Throat; Nausea; and Chills    HPI Tonya Novak is a 37 y.o. female patient complained of sore throat, chills, and nausea for 2 days.  Patient denies vomiting or diarrhea.  Patient rates the pain as 8/10.  Patient described the pain is "achy/sore".  No palliative measure for complaint.  Past Medical History:  Diagnosis Date  . Anxiety   . Depression   . PTSD (post-traumatic stress disorder)   . Seizure [facility](HCC)     Patient Active Problem List   Diagnosis Date Noted  . 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 DISORDER, BIPOLAR NOS 05/11/2006  . MARIJUANA ABUSE 05/11/2006  . INSOMNIA, CHRONIC 05/11/2006  . DEPRESSION 05/11/2006  . 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 MOUTH SURGERY      Prior to Admission medications   Medication Sig Start Date End Date Taking? Authorizing Provider  ibuprofen (ADVIL,MOTRIN) 600 MG tablet Take 1 tablet (600 mg total) by mouth every 8 (eight) hours as needed. 03/29/17   Joni ReiningSmith, Mehar Kirkwood K, PA-C  lidocaine (XYLOCAINE) 2 % solution Use as directed 5 mLs in the mouth or throat every 6 (six) hours as needed for mouth pain. Mix with 5 mL of Phenergan DM for swish and swallow. 03/29/17   Joni ReiningSmith, Cristobal Advani K, PA-C  promethazine-dextromethorphan (PROMETHAZINE-DM) 6.25-15 MG/5ML syrup Take 5 mLs by mouth 4 (four) times daily as needed for cough. Mix with 5 mL of viscous lidocaine for swish and swallow. 03/29/17   Joni ReiningSmith, Maze Corniel K, PA-C    Allergies Lamotrigine and Pseudoephedrine  No family history on file.  Social History Social History   Tobacco Use  . Smoking status: Never Smoker  . Smokeless tobacco: Never Used  Substance Use Topics  . Alcohol use: Yes    . Drug use: No    Review of Systems Constitutional: No fever/chills Eyes: No visual changes. ENT: No sore throat. Cardiovascular: Denies chest pain. Respiratory: Denies shortness of breath. Gastrointestinal: No abdominal pain.  No nausea, no vomiting.  No diarrhea.  No constipation. Genitourinary: Negative for dysuria. Musculoskeletal: Negative for back pain. Skin: Negative for rash. Neurological: Negative for headaches, focal weakness or numbness. Hematological/Lymphatic: Allergic/Immunilogical: See medication list  ____________________________________________   PHYSICAL EXAM:  VITAL SIGNS: ED Triage Vitals  Enc Vitals Group     BP 03/29/17 1312 (!) 152/92     Pulse Rate 03/29/17 1312 92     Resp 03/29/17 1312 18     Temp 03/29/17 1312 98.5 F (36.9 C)     Temp Source 03/29/17 1312 Oral     SpO2 03/29/17 1312 99 %     Weight 03/29/17 1313 150 lb (68 kg)     Height 03/29/17 1313 5\' 3"  (1.6 m)     Head Circumference --      Peak Flow --      Pain Score 03/29/17 1313 8     Pain Loc --      Pain Edu? --      Excl. in GC? --    Constitutional: Alert and oriented. Well appearing and in no acute distress. Nose: Edematous nasal turbinates clear rhinorrhea.   Mouth/Throat: Mucous membranes are moist.  Oropharynx non-erythematous.  Postnasal drainage Neck: No stridor.  Hematological/Lymphatic/Immunilogical:  No cervical lymphadenopathy. Cardiovascular: Normal rate, regular rhythm. Grossly normal heart sounds.  Good peripheral circulation. Respiratory: Normal respiratory effort.  No retractions. Lungs CTAB. Neurologic:  Normal speech and language. No gross focal neurologic deficits are appreciated. No gait instability. Skin:  Skin is warm, dry and intact. No rash noted. Psychiatric: Mood and affect are normal. Speech and behavior are normal.  ____________________________________________   LABS (all labs ordered are listed, but only abnormal results are displayed)  Labs  Reviewed  GROUP A STREP BY PCR   ____________________________________________  EKG   ____________________________________________  RADIOLOGY  ED MD interpretation:    Official radiology report(s): No results found.  ____________________________________________   PROCEDURES  Procedure(s) performed: None  Procedures  Critical Care performed: No  ____________________________________________   INITIAL IMPRESSION / ASSESSMENT AND PLAN / ED COURSE  As part of my medical decision making, I reviewed the following data within the electronic MEDICAL RECORD NUMBER    Sore throat secondary to viral illness.  Discussed negative strep results with patient.  Patient given discharge care instruction.  Patient advised take medication as directed.  Patient given a work note.  Patient [facility].      ____________________________________________   FINAL CLINICAL IMPRESSION(S) / ED DIAGNOSES  Final diagnoses:  Viral pharyngitis  Viral illness     ED Discharge Orders        Ordered    promethazine-dextromethorphan (PROMETHAZINE-DM) 6.25-15 MG/5ML syrup  4 times daily PRN     03/29/17 1421    lidocaine (XYLOCAINE) 2 % solution  Every 6 hours PRN     03/29/17 1421    ibuprofen (ADVIL,MOTRIN) 600 MG tablet  Every 8 hours PRN     03/29/17 1421       Note:  This document was prepared using Dragon voice recognition software and may include unintentional dictation errors.    Joni Reining, PA-C 03/29/17 1425    Emily Filbert, MD 03/29/17 1500

## 2017-03-29 NOTE — ED Triage Notes (Signed)
Sore throat, chills and nausea since last Tuesday, no distress noted.

## 2017-05-25 ENCOUNTER — Encounter: Payer: Self-pay | Admitting: Emergency Medicine

## 2017-05-25 ENCOUNTER — Other Ambulatory Visit: Payer: Self-pay

## 2017-05-25 ENCOUNTER — Emergency Department
Admission: EM | Admit: 2017-05-25 | Discharge: 2017-05-25 | Disposition: A | Discharge status: Home / Self Care | Payer: Self-pay | Attending: Student in an Organized Health Care Education/Training Program | Admitting: Student in an Organized Health Care Education/Training Program

## 2017-05-25 DIAGNOSIS — J069 Acute upper respiratory infection, unspecified: Secondary | ICD-10-CM | POA: Insufficient documentation

## 2017-05-25 DIAGNOSIS — B9789 Other viral agents as the cause of diseases classified elsewhere: Secondary | ICD-10-CM | POA: Insufficient documentation

## 2017-05-25 MED ORDER — PROMETHAZINE-DM 6.25-15 MG/5ML PO SYRP: 5.0000 mL | ORAL_SOLUTION | Freq: Four times a day (QID) | ORAL | 0 refills | Status: DC | PRN

## 2017-05-25 NOTE — ED Provider Notes (Signed)
[facility] Emergency Department Provider Note  ____________________________________________   First MD Initiated Contact with Patient 05/25/17 1916     (approximate)  I have reviewed the triage vital signs and the nursing notes.   HISTORY  Chief Complaint Cough and Fever   HPI Tonya Novak is a 37 y.o. female complaint of cough and congestion along with some low-grade temps for the last 4 to 5 days.  Patient has not taken any over-the-counter medication for her symptoms.  She denies smoking.  Denies any history of asthma.  She rates her discomfort as an 8 out of 10.   Past Medical History:  Diagnosis Date  . Anxiety   . Depression   . PTSD (post-traumatic stress disorder)   . Seizure [facility])     Patient Active Problem List   Diagnosis Date Noted  . 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 DISORDER, BIPOLAR NOS 05/11/2006  . MARIJUANA ABUSE 05/11/2006  . INSOMNIA, CHRONIC 05/11/2006  . DEPRESSION 05/11/2006  . 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 MOUTH SURGERY      Prior to Admission medications   Medication Sig Start Date End Date Taking? Authorizing Provider  ibuprofen (ADVIL,MOTRIN) 600 MG tablet Take 1 tablet (600 mg total) by mouth every 8 (eight) hours as needed. 03/29/17   Joni Reining, PA-C  lidocaine (XYLOCAINE) 2 % solution Use as directed 5 mLs in the mouth or throat every 6 (six) hours as needed for mouth pain. Mix with 5 mL of Phenergan DM for swish and swallow. 03/29/17   Joni Reining, PA-C  promethazine-dextromethorphan (PROMETHAZINE-DM) 6.25-15 MG/5ML syrup Take 5 mLs by mouth 4 (four) times daily as needed for cough. 05/25/17   Tommi Rumps, PA-C    Allergies Lamotrigine and Pseudoephedrine  No family history on file.  Social History Social History   Tobacco Use  . Smoking status: Never Smoker  . Smokeless tobacco: Never Used  Substance Use Topics  .  Alcohol use: Yes  . Drug use: No    Review of Systems Constitutional: No fever/chills Eyes: No visual changes. ENT: No sore throat.  Positive nasal congestion. Cardiovascular: Denies chest pain. Respiratory: Denies shortness of breath.  Positive cough. Gastrointestinal: No abdominal pain.  No nausea, no vomiting.  Neurological: Negative for headaches ____________________________________________   PHYSICAL EXAM:  VITAL SIGNS: ED Triage Vitals  Enc Vitals Group     BP 05/25/17 1906 (!) 136/93     Pulse Rate 05/25/17 1906 (!) 108     Resp --      Temp 05/25/17 1906 99.6 F (37.6 C)     Temp Source 05/25/17 1906 Oral     SpO2 05/25/17 1906 98 %     Weight 05/25/17 1907 155 lb (70.3 kg)     Height 05/25/17 1907 5\' 3"  (1.6 m)     Head Circumference --      Peak Flow --      Pain Score 05/25/17 1907 8     Pain Loc --      Pain Edu? --      Excl. in GC? --    Constitutional: Alert and oriented. Well appearing and in no acute distress. Eyes: Conjunctivae are normal.  Head: Atraumatic. Nose: Positive congestion/rhinnorhea.  TMs are dull bilaterally but without erythema or injection. Mouth/Throat: Mucous membranes are moist.  Oropharynx non-erythematous.  Posterior drainage present. Neck: No stridor.   Hematological/Lymphatic/Immunilogical: No cervical  lymphadenopathy. Cardiovascular: Normal rate, regular rhythm. Grossly normal heart sounds.  Good peripheral circulation. Respiratory: Normal respiratory effort.  No retractions. Lungs CTAB. Musculoskeletal: Moves upper and lower extremities any difficulty.  Normal gait was noted. Neurologic:  Normal speech and language. No gross focal neurologic deficits are appreciated. Skin:  Skin is warm, dry and intact. No rash noted. Psychiatric: Mood and affect are normal. Speech and behavior are normal.  ____________________________________________   LABS (all labs ordered are listed, but only abnormal results are displayed)  Labs  Reviewed - No data to display  PROCEDURES  Procedure(s) performed: None  Procedures  Critical Care performed: No  ____________________________________________   INITIAL IMPRESSION / ASSESSMENT AND PLAN / ED COURSE Patient was given a prescription for Phenergan DM as needed for cough and congestion.  She is to increase fluids.  Tylenol if needed for fever or headache.  Patient is to follow-up with 1 of the clinics listed on her discharge papers or paperwork provided for her. ____________________________________________   FINAL CLINICAL IMPRESSION(S) / ED DIAGNOSES  Final diagnoses:  Viral URI with cough     ED Discharge Orders        Ordered    promethazine-dextromethorphan (PROMETHAZINE-DM) 6.25-15 MG/5ML syrup  4 times daily PRN     05/25/17 1927       Note:  This document was prepared using Dragon voice recognition software and may include unintentional dictation errors.    Tommi RumpsSummers, Timothy Townsel L, PA-C 05/25/17 2010    Willy Eddyobinson, Patrick, MD 05/25/17 2050

## 2017-05-25 NOTE — ED Triage Notes (Signed)
Patient reports fever and cough since Sunday, worse today.

## 2017-05-25 NOTE — ED Notes (Signed)
Reviewed discharge instructions, follow-up care, and prescriptions with patient. Patient verbalized understanding of all information reviewed. Patient stable, with no distress noted at this time.    

## 2017-05-25 NOTE — Discharge Instructions (Signed)
Begin taking promethazine and dextromethorphan as needed for cough and congestion.  Increase fluids.  Tylenol if needed for pain or fever. Also establish primary care with 1 of the clinics listed with your discharge papers.

## 2017-05-25 NOTE — ED Notes (Signed)
Pt c/o of generalized body aches, productive cough, and fever at home x6 days. Pt reports nausea and denies any diarrhea.

## 2017-05-29 ENCOUNTER — Other Ambulatory Visit: Payer: Self-pay

## 2017-05-29 ENCOUNTER — Emergency Department
Admission: EM | Admit: 2017-05-29 | Discharge: 2017-05-30 | Disposition: A | Discharge status: Home / Self Care | Payer: Medicaid Other | Attending: Emergency Medicine | Admitting: Emergency Medicine

## 2017-05-29 DIAGNOSIS — F419 Anxiety disorder, unspecified: Secondary | ICD-10-CM | POA: Insufficient documentation

## 2017-05-29 DIAGNOSIS — F332 Major depressive disorder, recurrent severe without psychotic features: Secondary | ICD-10-CM

## 2017-05-29 DIAGNOSIS — F329 Major depressive disorder, single episode, unspecified: Secondary | ICD-10-CM

## 2017-05-29 DIAGNOSIS — R05 Cough: Secondary | ICD-10-CM | POA: Insufficient documentation

## 2017-05-29 DIAGNOSIS — R45851 Suicidal ideations: Secondary | ICD-10-CM | POA: Insufficient documentation

## 2017-05-29 DIAGNOSIS — J3489 Other specified disorders of nose and nasal sinuses: Secondary | ICD-10-CM | POA: Insufficient documentation

## 2017-05-29 DIAGNOSIS — Z79899 Other long term (current) drug therapy: Secondary | ICD-10-CM | POA: Insufficient documentation

## 2017-05-29 DIAGNOSIS — R51 Headache: Secondary | ICD-10-CM | POA: Insufficient documentation

## 2017-05-29 DIAGNOSIS — F4310 Post-traumatic stress disorder, unspecified: Secondary | ICD-10-CM

## 2017-05-29 DIAGNOSIS — F32A Depression, unspecified: Secondary | ICD-10-CM

## 2017-05-29 LAB — COMPREHENSIVE METABOLIC PANEL
ALK PHOS: 64 U/L (ref 38–126)
ALT: 15 U/L (ref 14–54)
AST: 21 U/L (ref 15–41)
Albumin: 4.4 g/dL (ref 3.5–5.0)
Anion gap: 13 (ref 5–15)
BILIRUBIN TOTAL: 0.7 mg/dL (ref 0.3–1.2)
BUN: 14 mg/dL (ref 6–20)
CHLORIDE: 105 mmol/L (ref 101–111)
CO2: 21 mmol/L — ABNORMAL LOW (ref 22–32)
CREATININE: 0.71 mg/dL (ref 0.44–1.00)
Calcium: 9.4 mg/dL (ref 8.9–10.3)
GFR calc Af Amer: >60 mL/min (ref >60)
GFR calc non Af Amer: >60 mL/min (ref >60)
Glucose, Bld: 87 mg/dL (ref 65–99)
Potassium: 3.4 mmol/L — ABNORMAL LOW (ref 3.5–5.1)
Sodium: 139 mmol/L (ref 135–145)
Total Protein: 8.8 g/dL — ABNORMAL HIGH (ref 6.5–8.1)

## 2017-05-29 LAB — CBC
HEMATOCRIT: 46.4 % (ref 35.0–47.0)
Hemoglobin: 15.5 g/dL (ref 12.0–16.0)
MCH: 29.8 pg (ref 26.0–34.0)
MCHC: 33.4 g/dL (ref 32.0–36.0)
MCV: 89.1 fL (ref 80.0–100.0)
Platelets: 280 10*3/uL (ref 150–440)
RBC: 5.21 MIL/uL — ABNORMAL HIGH (ref 3.80–5.20)
RDW: 15.1 % — AB (ref 11.5–14.5)
WBC: 6.1 10*3/uL (ref 3.6–11.0)

## 2017-05-29 LAB — URINE DRUG SCREEN, QUALITATIVE (ARMC ONLY)
Amphetamines, Ur Screen: NOT DETECTED
BARBITURATES, UR SCREEN: NOT DETECTED
BENZODIAZEPINE, UR SCRN: NOT DETECTED
Cannabinoid 50 Ng, Ur ~~LOC~~: POSITIVE — AB
Cocaine Metabolite,Ur ~~LOC~~: NOT DETECTED
MDMA (Ecstasy)Ur Screen: NOT DETECTED
METHADONE SCREEN, URINE: NOT DETECTED
Opiate, Ur Screen: NOT DETECTED
PHENCYCLIDINE (PCP) UR S: NOT DETECTED
Tricyclic, Ur Screen: NOT DETECTED

## 2017-05-29 LAB — ETHANOL

## 2017-05-29 MED ORDER — ACETAMINOPHEN 500 MG PO TABS
1000.0000 mg | ORAL_TABLET | Freq: Once | ORAL | Status: AC
Start: 2017-05-29 — End: 2017-05-29
  Administered 2017-05-29: 1000 mg via ORAL
  Filled 2017-05-29: qty 2

## 2017-05-29 MED ORDER — BACITRACIN-NEOMYCIN-POLYMYXIN 400-5-5000 EX OINT
TOPICAL_OINTMENT | CUTANEOUS | Status: DC | PRN
  Administered 2017-05-29: via TOPICAL
  Filled 2017-05-29: qty 1

## 2017-05-29 MED ORDER — LORAZEPAM 1 MG PO TABS
1.0000 mg | ORAL_TABLET | Freq: Once | ORAL | Status: AC
  Administered 2017-05-29: 1 mg via ORAL
  Filled 2017-05-29: qty 1

## 2017-05-29 NOTE — ED Provider Notes (Signed)
[facility] Emergency Department Provider Note  Time seen: 6:47 PM  I have reviewed the triage vital signs and the nursing notes.   HISTORY  Chief Complaint Migraine and Psychiatric Evaluation    HPI Tonya Novak is a 37 y.o. female with a past medical history of anxiety, depression, PTSD, seizure disorder, presents to the emergency department for depression and suicidal ideation.  According to the patient for the past several weeks he has had progressively worsening depression.  Now she is having thoughts of not being around anymore per patient.  Denies any drug or alcohol use.  No medical complaints besides recent cough and congestion for which she has been seen in the emergency department as well.  Just a mild headache.  Patient is very tearful throughout examination states she had a reconnection with an ex-boyfriend that was a very bad idea per patient.  States she has a new job.  States increased stress, she is off of her depression medication and has worsening depression.   Past Medical History:  Diagnosis Date  . Anxiety   . Depression   . PTSD (post-traumatic stress disorder)   . Seizure [facility])     Patient Active Problem List   Diagnosis Date Noted  . 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 DISORDER, BIPOLAR NOS 05/11/2006  . MARIJUANA ABUSE 05/11/2006  . INSOMNIA, CHRONIC 05/11/2006  . DEPRESSION 05/11/2006  . 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 MOUTH SURGERY      Prior to Admission medications   Medication Sig Start Date End Date Taking? Authorizing Provider  hydrOXYzine (VISTARIL) 25 MG capsule Take 1 capsule by mouth 2 (two) times daily as needed for anxiety.  03/30/17  Yes [provider]  ibuprofen (ADVIL,MOTRIN) 600 MG tablet Take 1 tablet (600 mg total) by mouth every 8 (eight) hours as needed. Patient not taking: Reported on 05/29/2017 03/29/17   Joni Reining, PA-C   lidocaine (XYLOCAINE) 2 % solution Use as directed 5 mLs in the mouth or throat every 6 (six) hours as needed for mouth pain. Mix with 5 mL of Phenergan DM for swish and swallow. Patient not taking: Reported on 05/29/2017 03/29/17   Joni Reining, PA-C  promethazine-dextromethorphan (PROMETHAZINE-DM) 6.25-15 MG/5ML syrup Take 5 mLs by mouth 4 (four) times daily as needed for cough. Patient not taking: Reported on 05/29/2017 05/25/17   Tommi Rumps, PA-C    Allergies  Allergen Reactions  . Lamotrigine Other (See Comments)    Numbness to arms   . Pseudoephedrine Other (See Comments)    "I feel like I am falling"    No family history on file.  Social History Social History   Tobacco Use  . Smoking status: Never Smoker  . Smokeless tobacco: Never Used  Substance Use Topics  . Alcohol use: Yes  . Drug use: No    Review of Systems Constitutional: Negative for fever. Eyes: Negative for visual complaints ENT: Mild congestion Cardiovascular: Negative for chest pain. Respiratory: Negative for shortness of breath.  Positive for cough. Gastrointestinal: Negative for abdominal pain, vomiting  Genitourinary: Negative for urinary compaints Musculoskeletal: Negative for musculoskeletal complaints Skin: Negative for skin complaints  Neurological: Mild to moderate headache All other ROS negative  ____________________________________________   PHYSICAL EXAM:  VITAL SIGNS: ED Triage Vitals  Enc Vitals Group     BP 05/29/17 1740 (!) 144/96     Pulse Rate 05/29/17 1740 (!) 118  Resp 05/29/17 1740 18     Temp 05/29/17 1740 98.2 F (36.8 C)     Temp Source 05/29/17 1740 Oral     SpO2 05/29/17 1740 96 %     Weight 05/29/17 1740 155 lb (70.3 kg)     Height 05/29/17 1740  (1.6 m)     Head Circumference --      Peak Flow --      Pain Score 05/29/17 1745 8     Pain Loc --      Pain Edu? --      Excl. in GC? --     Constitutional: Alert and oriented. Well appearing  but tearful throughout exam. Eyes: Normal exam ENT   Head: Normocephalic and atraumatic   Mouth/Throat: Mucous membranes are moist. Cardiovascular: Normal rate, regular rhythm. No murmur Respiratory: Normal respiratory effort without tachypnea nor retractions. Breath sounds are clear Gastrointestinal: Soft and nontender. No distention.   Musculoskeletal: Nontender with normal range of motion in all extremities. No lower extremity tenderness or edema. Neurologic:  Normal speech and language. No gross focal neurologic deficits are appreciated. Skin:  Skin is warm, dry and intact.  Psychiatric: Very tearful throughout examination.  ____________________________________________   INITIAL IMPRESSION / ASSESSMENT AND PLAN / ED COURSE  Pertinent labs & imaging results that were available during my care of the patient were reviewed by me and considered in my medical decision making (see chart for details).  Patient presents to the emergency department for worsening depression now with having thoughts of not being around any longer.  Given the patient's worsening depression very tearful throughout examination and admitted suicidal ideation we will place the patient under an involuntary commitment.  We will have psychiatry and TTS see and evaluate the patient.  Patient's only medical complaints is of a headache and mild cough/congestion.  We will treat with Tylenol while monitoring the patient.  Labs are pending at this time.  Medical labs are largely nonrevealing.  Cannabinoid positive urine drug screen.  Psychiatric evaluation pending.  ____________________________________________   FINAL CLINICAL IMPRESSION(S) / ED DIAGNOSES  Suicidal ideation Depression    Minna Antis, MD 05/29/17 2329

## 2017-05-29 NOTE — ED Notes (Signed)

## 2017-05-29 NOTE — ED Notes (Signed)
Pt dressed out in burgundy scrubs per this RN.  Clothing, shoes, cell phone all handed off to patients boyfriend to take home.

## 2017-05-29 NOTE — ED Notes (Signed)

## 2017-05-29 NOTE — ED Notes (Addendum)
Pt continues to be extremely tearful, difficulty finishing a sentence without being upset.  Pt reports severe depression x 2 weeks, unable to get out of bed x 3 days, states, "I just feel like I cant get up."  Pt denies SI/HI at this time.  Will initiate behavioral protocol.

## 2017-05-29 NOTE — ED Triage Notes (Signed)
Pt in via POV with complaints of migraine x 3 days, reports associated dizziness and blurred vision.  Pt denies hx of same.  Pt tearful in triage, very vague in regards to why.  NAD noted at this time.
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
## 2017-05-29 NOTE — ED Notes (Signed)
Pt. Alert and oriented, warm and dry, in no distress. Pt. Denies SI, HI, and AVH. Pt states, "I have been very depressed, but not having any thoughts of hurting myself." Pt. Encouraged to let nursing staff know of any concerns or needs.

## 2017-05-29 NOTE — ED Notes (Signed)
Patient complained of superficial scratch to right inner thigh. Verbal order for neosporin. Ointment applied to area.

## 2017-05-30 DIAGNOSIS — F4310 Post-traumatic stress disorder, unspecified: Secondary | ICD-10-CM

## 2017-05-30 DIAGNOSIS — F332 Major depressive disorder, recurrent severe without psychotic features: Secondary | ICD-10-CM

## 2017-05-30 MED ORDER — SERTRALINE HCL 100 MG PO TABS: 150.0000 mg | ORAL_TABLET | Freq: Every day | ORAL | 1 refills | Status: AC

## 2017-05-30 NOTE — BH Assessment (Signed)
Per Dr. Clapac's patient doesn't meet inpatient criteria- D/C 

## 2017-05-30 NOTE — ED Provider Notes (Signed)
-----------------------------------------   6:37 AM on 05/30/2017 -----------------------------------------   Blood pressure (!) 144/96, pulse (!) 118, temperature 98.2 F (36.8 C), temperature source Oral, resp. rate 18, height 1.6 m ( ), weight 70.3 kg (155 lb), last menstrual period 05/24/2017, SpO2 96 %.  The patient had no acute events since last update.  Calm and cooperative at this time.  Patient is under IVC and is pending psychiatric evaluation.     Loleta Rose, MD 05/30/17 [phone]

## 2017-05-30 NOTE — ED Notes (Signed)
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
## 2017-05-30 NOTE — ED Notes (Signed)
BEHAVIORAL HEALTH ROUNDING Patient sleeping: No. Patient alert and oriented: yes Behavior appropriate: Yes.  ; If no, describe:  Nutrition and fluids offered: yes Toileting and hygiene offered: Yes  Sitter present: q15 minute observations and security monitoring Law enforcement present: Yes    

## 2017-05-30 NOTE — ED Notes (Signed)
Patient observed lying in bed with eyes closed  Even, unlabored respirations observed   NAD pt appears to be sleeping  I will continue to monitor along with every 15 minute visual observations and ongoing security monitoring    

## 2017-05-30 NOTE — ED Provider Notes (Signed)
-----------------------------------------   2:08 PM on 05/30/2017 -----------------------------------------  In no acute distress evaluate by psychiatry, she was medically cleared prior to my arrival.  Psychiatry feels that she does not need to stay in the hospital.  She is not got ongoing SI or HI, they feel she is safe for discharge.  Patient contracts for safety.  Return precautions follow-up and instructions given and understood   Jeanmarie Plant, MD 05/30/17 1409

## 2017-05-30 NOTE — ED Notes (Signed)
ED  Is the patient under IVC or is there intent for IVC: Yes.   Is the patient medically cleared: Yes.   Is there vacancy in the ED BHU: Yes.   Is the population mix appropriate for patient: Yes.   Is the patient awaiting placement in inpatient or outpatient setting:  Has the patient had a psychiatric consult:  pending Survey of unit performed for contraband, proper placement and condition of furniture, tampering with fixtures in bathroom, shower, and each patient room: Yes.  ; Findings:  APPEARANCE/BEHAVIOR Calm and cooperative NEURO ASSESSMENT Orientation: oriented x3  Denies pain Hallucinations: No.None noted (Hallucinations) denies  Speech: Normal Gait: normal RESPIRATORY ASSESSMENT Even  Unlabored respirations  CARDIOVASCULAR ASSESSMENT Pulses equal   regular rate  Skin warm and dry   GASTROINTESTINAL ASSESSMENT no GI complaint EXTREMITIES Full ROM  PLAN OF CARE Provide calm/safe environment. Vital signs assessed twice daily. ED BHU Assessment once each 12-hour shift. Collaborate with TTS daily or as condition indicates. Assure the ED provider has rounded once each shift. Provide and encourage hygiene. Provide redirection as needed. Assess for escalating behavior; address immediately and inform ED provider.  Assess family dynamic and appropriateness for visitation as needed: Yes.  ; If necessary, describe findings:  Educate the patient/family about BHU procedures/visitation: Yes.  ; If necessary, describe findings:   

## 2017-05-30 NOTE — ED Notes (Signed)
PT IVC PENDING PSYCH CONSULT. 

## 2018-06-13 ENCOUNTER — Encounter: Payer: Self-pay | Admitting: Emergency Medicine

## 2018-06-13 ENCOUNTER — Other Ambulatory Visit: Payer: Self-pay

## 2018-06-13 ENCOUNTER — Emergency Department
Admission: EM | Admit: 2018-06-13 | Discharge: 2018-06-13 | Disposition: A | Discharge status: Home / Self Care | Payer: Medicaid Other | Attending: Emergency Medicine | Admitting: Emergency Medicine

## 2018-06-13 DIAGNOSIS — R630 Anorexia: Secondary | ICD-10-CM | POA: Insufficient documentation

## 2018-06-13 DIAGNOSIS — R262 Difficulty in walking, not elsewhere classified: Secondary | ICD-10-CM | POA: Insufficient documentation

## 2018-06-13 DIAGNOSIS — R202 Paresthesia of skin: Secondary | ICD-10-CM

## 2018-06-13 DIAGNOSIS — R002 Palpitations: Secondary | ICD-10-CM | POA: Insufficient documentation

## 2018-06-13 DIAGNOSIS — D649 Anemia, unspecified: Secondary | ICD-10-CM

## 2018-06-13 DIAGNOSIS — R209 Unspecified disturbances of skin sensation: Secondary | ICD-10-CM | POA: Insufficient documentation

## 2018-06-13 LAB — BASIC METABOLIC PANEL
Anion gap: 7 (ref 5–15)
BUN: 11 mg/dL (ref 6–20)
CO2: 22 mmol/L (ref 22–32)
Calcium: 8.7 mg/dL — ABNORMAL LOW (ref 8.9–10.3)
Chloride: 109 mmol/L (ref 98–111)
Creatinine, Ser: 0.6 mg/dL (ref 0.44–1.00)
GFR calc Af Amer: >60 mL/min (ref >60)
GFR calc non Af Amer: >60 mL/min (ref >60)
Glucose, Bld: 90 mg/dL (ref 70–99)
Potassium: 3.5 mmol/L (ref 3.5–5.1)
Sodium: 138 mmol/L (ref 135–145)

## 2018-06-13 LAB — CBC WITH DIFFERENTIAL/PLATELET
Abs Immature Granulocytes: 0.01 10*3/uL (ref 0.00–0.07)
Basophils Absolute: 0 10*3/uL (ref 0.0–0.1)
Basophils Relative: 1 %
Eosinophils Absolute: 0.1 10*3/uL (ref 0.0–0.5)
Eosinophils Relative: 2 %
HCT: 34.1 % — ABNORMAL LOW (ref 36.0–46.0)
Hemoglobin: 10.9 g/dL — ABNORMAL LOW (ref 12.0–15.0)
Immature Granulocytes: 0 %
Lymphocytes Relative: 62 %
Lymphs Abs: 3.4 10*3/uL (ref 0.7–4.0)
MCH: 28.8 pg (ref 26.0–34.0)
MCHC: 32 g/dL (ref 30.0–36.0)
MCV: 90 fL (ref 80.0–100.0)
Monocytes Absolute: 0.4 10*3/uL (ref 0.1–1.0)
Monocytes Relative: 7 %
Neutro Abs: 1.6 10*3/uL — ABNORMAL LOW (ref 1.7–7.7)
Neutrophils Relative %: 28 %
Platelets: 305 10*3/uL (ref 150–400)
RBC: 3.79 MIL/uL — ABNORMAL LOW (ref 3.87–5.11)
RDW: 13.8 % (ref 11.5–15.5)
WBC: 5.5 10*3/uL (ref 4.0–10.5)
nRBC: 0 % (ref 0.0–0.2)

## 2018-06-13 LAB — PREGNANCY, URINE: Preg Test, Ur: NEGATIVE

## 2018-06-13 NOTE — Discharge Instructions (Signed)
Please seek medical attention for any high fevers, chest pain, shortness of breath, change in behavior, persistent vomiting, bloody stool or any other new or concerning symptoms.  

## 2018-06-13 NOTE — ED Provider Notes (Signed)
[facility] Emergency Department Provider Note   ____________________________________________   I have reviewed the triage vital signs and the nursing notes.   HISTORY  Chief Complaint Numbness  History limited by: Not Limited   HPI Tonya Novak is a 38 y.o. female who presents to the emergency department today because of concern for numbness and odd sensation of her lower legs. She states that this sensation started last night. She did drink a little and smoke a little marijuana last night. Since then she felt like she has not come down. She has numbness in bilateral legs. Has felt like she has had some trouble walking and has had to concentrate on walking with the numbness. The patient says that she has never had symptoms like this before. Associated with the lower leg symptoms she also has felt some heart palpitations and that her heart has been racing. She denies any recent fevers, nausea vomiting or diarrhea. Does feel like she has had slight decrease in appetite.    Records reviewed. Per medical record review patient has a history of anxiety, depression.   Past Medical History:  Diagnosis Date  . Anxiety   . Depression   . PTSD (post-traumatic stress disorder)   . Seizure [facility](HCC)     Patient Active Problem List   Diagnosis Date Noted  . Severe recurrent major depression without psychotic features (HCC) 05/30/2017  . PTSD (post-traumatic stress disorder) 05/30/2017  . 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 DISORDER, BIPOLAR NOS 05/11/2006  . MARIJUANA ABUSE 05/11/2006  . INSOMNIA, CHRONIC 05/11/2006  . DEPRESSION 05/11/2006  . 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 MOUTH SURGERY      Prior to Admission medications   Medication Sig Start Date End Date Taking? Authorizing Provider  sertraline (ZOLOFT) 100 MG tablet Take 1.5 tablets (150 mg total) by mouth daily. 05/30/17 07/29/17  Clapacs,  Jackquline DenmarkJohn T, MD    Allergies Lamotrigine and Pseudoephedrine  No family history on file.  Social History Social History   Tobacco Use  . Smoking status: Never Smoker  . Smokeless tobacco: Never Used  Substance Use Topics  . Alcohol use: Yes  . Drug use: No    Review of Systems Constitutional: No fever/chills Eyes: No visual changes. ENT: No sore throat. Cardiovascular: Positive for palpitations.  Respiratory: Denies shortness of breath. Gastrointestinal: Positive for decreased appetite.  Genitourinary: Negative for dysuria. Musculoskeletal: Positive for bilateral calf pain that occurred yesterday Skin: Negative for rash. Neurological: Positive for numbness to lower legs.  ____________________________________________   PHYSICAL EXAM:  VITAL SIGNS: ED Triage Vitals  Enc Vitals Group     BP 06/13/18 0652 (!) 145/101     Pulse Rate 06/13/18 0652 (!) 103     Resp 06/13/18 0652 (!) 23     Temp 06/13/18 0652 98.9 F (37.2 C)     Temp Source 06/13/18 0652 Oral     SpO2 06/13/18 0652 100 %     Weight 06/13/18 0639 155 lb (70.3 kg)     Height 06/13/18 0639 5\' 3"  (1.6 m)     Head Circumference --      Peak Flow --      Pain Score 06/13/18 0639 7     Pain Loc --      Pain Edu? --      Excl. in GC? --      Constitutional: Alert and oriented.  Eyes: Conjunctivae are normal.  ENT      Head: Normocephalic and atraumatic.      Nose: No congestion/rhinnorhea.      Mouth/Throat: Mucous membranes are moist.      Neck: No stridor. Hematological/Lymphatic/Immunilogical: No cervical lymphadenopathy. Cardiovascular: Normal rate, regular rhythm.  No murmurs, rubs, or gallops.  Respiratory: Normal respiratory effort without tachypnea nor retractions. Breath sounds are clear and equal bilaterally. No wheezes/rales/rhonchi. Gastrointestinal: Soft and non tender. No rebound. No guarding.  Genitourinary: Deferred Musculoskeletal: Normal range of motion in all extremities. No lower  extremity edema. Neurologic:  Normal speech and language. No gross focal neurologic deficits are appreciated.  Skin:  Skin is warm, dry and intact. No rash noted. Psychiatric: Mood and affect are normal. Speech and behavior are normal. Patient exhibits appropriate insight and judgment.  ____________________________________________    LABS (pertinent positives/negatives)  Upreg negative CBC wbc 5.5, hgb 10.9, plt 305 BMP wnl except ca 8.7  ____________________________________________   EKG  I, Phineas Semen, attending physician, personally viewed and interpreted this EKG  EKG Time: 0654 Rate: 87 Rhythm: sinus rhythm Axis: normal Intervals: qtc 424 QRS: narrow ST changes: no st elevation Impression: normal ekg  ____________________________________________    RADIOLOGY  None ____________________________________________   PROCEDURES  Procedures  ____________________________________________   INITIAL IMPRESSION / ASSESSMENT AND PLAN / ED COURSE  Pertinent labs & imaging results that were available during my care of the patient were reviewed by me and considered in my medical decision making (see chart for details).   Patient presented to the emergency department today with primary concern for numbness and odd sensation to bilateral lower legs. Blood work without obvious electrolyte cause, ca minimally low however k within normal limits. Patient was found to be slightly anemic. I did discuss this with the patient. At this point do wonder if the sensation is medication/marijuana related. Discussed with patient importance of follow up with her physician. Doubt intracranial process or central cord lesion.   ____________________________________________   FINAL CLINICAL IMPRESSION(S) / ED DIAGNOSES  Final diagnoses:  Paresthesias  Anemia, unspecified type     Note: This dictation was prepared with Dragon dictation. Any transcriptional errors that result from this  process are unintentional     Phineas Semen, MD 06/13/18 [phone]

## 2018-06-13 NOTE — ED Notes (Signed)
Report received 

## 2018-06-13 NOTE — ED Notes (Signed)
Pt ambulatory to room 25 to be placed on card monitor for EKG and further eval by EDT Ian Malkin; report called to care nurse Margarito Courser, RN

## 2018-06-13 NOTE — ED Triage Notes (Addendum)
Patient ambulatory to triage with steady gait, without difficulty or distress noted; pt reports heart racing x wk accomp by "numbness all over"; st unsure if having rx to seroquel that was started 2-3wks for insomnia; also c/o lower abd pain and pain to calfs "like I been working out"

## 2018-07-04 ENCOUNTER — Other Ambulatory Visit: Payer: Self-pay

## 2018-07-04 ENCOUNTER — Encounter: Payer: Self-pay | Admitting: *Deleted

## 2018-07-04 DIAGNOSIS — E860 Dehydration: Secondary | ICD-10-CM | POA: Insufficient documentation

## 2018-07-04 DIAGNOSIS — R55 Syncope and collapse: Secondary | ICD-10-CM | POA: Insufficient documentation

## 2018-07-04 LAB — BASIC METABOLIC PANEL
Anion gap: 9 (ref 5–15)
BUN: 13 mg/dL (ref 6–20)
CO2: 21 mmol/L — ABNORMAL LOW (ref 22–32)
Calcium: 9.3 mg/dL (ref 8.9–10.3)
Chloride: 107 mmol/L (ref 98–111)
Creatinine, Ser: 0.75 mg/dL (ref 0.44–1.00)
GFR calc Af Amer: >60 mL/min (ref >60)
GFR calc non Af Amer: >60 mL/min (ref >60)
Glucose, Bld: 108 mg/dL — ABNORMAL HIGH (ref 70–99)
Potassium: 3.4 mmol/L — ABNORMAL LOW (ref 3.5–5.1)
Sodium: 137 mmol/L (ref 135–145)

## 2018-07-04 LAB — CBC
HCT: 36.4 % (ref 36.0–46.0)
Hemoglobin: 12.1 g/dL (ref 12.0–15.0)
MCH: 28.9 pg (ref 26.0–34.0)
MCHC: 33.2 g/dL (ref 30.0–36.0)
MCV: 87.1 fL (ref 80.0–100.0)
Platelets: 320 10*3/uL (ref 150–400)
RBC: 4.18 MIL/uL (ref 3.87–5.11)
RDW: 13.8 % (ref 11.5–15.5)
WBC: 5.2 10*3/uL (ref 4.0–10.5)
nRBC: 0 % (ref 0.0–0.2)

## 2018-07-04 LAB — URINALYSIS, COMPLETE (UACMP) WITH MICROSCOPIC
Bacteria, UA: NONE SEEN
Bilirubin Urine: NEGATIVE
Glucose, UA: NEGATIVE mg/dL
Ketones, ur: 80 mg/dL — AB
Nitrite: NEGATIVE
Protein, ur: NEGATIVE mg/dL
Specific Gravity, Urine: 1.027 (ref 1.005–1.030)
pH: 5 (ref 5.0–8.0)

## 2018-07-04 NOTE — ED Notes (Signed)
poct pregnancy Positive 

## 2018-07-04 NOTE — ED Triage Notes (Signed)
Pt reports passing out 5 days ago.pt struck head on the wall.    Pt has a headache.  Pt alert  Speech clear.  Ambulates without diff.

## 2018-07-05 ENCOUNTER — Emergency Department
Admission: EM | Admit: 2018-07-05 | Discharge: 2018-07-05 | Disposition: A | Discharge status: Home / Self Care | Payer: Medicaid Other | Attending: Emergency Medicine | Admitting: Emergency Medicine

## 2018-07-05 DIAGNOSIS — R55 Syncope and collapse: Secondary | ICD-10-CM

## 2018-07-05 DIAGNOSIS — E860 Dehydration: Secondary | ICD-10-CM

## 2018-07-05 LAB — POCT PREGNANCY, URINE: Preg Test, Ur: POSITIVE — AB

## 2018-07-05 MED ORDER — SODIUM CHLORIDE 0.9 % IV BOLUS
1000.0000 mL | Freq: Once | INTRAVENOUS | Status: AC
  Administered 2018-07-05: 1000 mL via INTRAVENOUS

## 2018-07-05 NOTE — ED Provider Notes (Signed)
[facility] Emergency Department Provider Note _   First MD Initiated Contact with Patient 07/05/18 [phone]     (approximate)  I have reviewed the triage vital signs and the nursing notes.   HISTORY  Chief Complaint Loss of Consciousness   HPI Tonya Novak is a 38 y.o. female below list of previous medical conditions presents to the emergency department with history of accidental fall 5 days ago with resultant head injury.  Patient does admit to consuming alcohol and smoking marijuana at the time of the fall.  Patient states that since the fall she has had ongoing headaches.  Patient denies any nausea or vomiting.  Patient denies any weakness numbness gait instability or visual changes.  After my evaluation patient notified the nurse that she is indeed pregnant with plans to have an abortion performed this week        Past Medical History:  Diagnosis Date  . Anxiety   . Depression   . PTSD (post-traumatic stress disorder)   . Seizure [facility](HCC)     Patient Active Problem List   Diagnosis Date Noted  . Severe recurrent major depression without psychotic features (HCC) 05/30/2017  . PTSD (post-traumatic stress disorder) 05/30/2017  . 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 DISORDER, BIPOLAR NOS 05/11/2006  . MARIJUANA ABUSE 05/11/2006  . INSOMNIA, CHRONIC 05/11/2006  . DEPRESSION 05/11/2006  . 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 MOUTH SURGERY      Prior to Admission medications   Medication Sig Start Date End Date Taking? Authorizing Provider  sertraline (ZOLOFT) 100 MG tablet Take 1.5 tablets (150 mg total) by mouth daily. 05/30/17 07/29/17  Clapacs, Jackquline DenmarkJohn T, MD    Allergies Lamotrigine and Pseudoephedrine  No family history on file.  Social History Social History   Tobacco Use  . Smoking status: Never Smoker  . Smokeless tobacco: Never Used  Substance Use Topics  . Alcohol use:  Yes  . Drug use: No    Review of Systems Constitutional: No fever/chills Eyes: No visual changes. ENT: No sore throat. Cardiovascular: Denies chest pain. Respiratory: Denies shortness of breath. Gastrointestinal: No abdominal pain.  No nausea, no vomiting.  No diarrhea.  No constipation. Genitourinary: Negative for dysuria. Musculoskeletal: Negative for neck pain.  Negative for back pain. Integumentary: Negative for rash. Neurological: Negative for headaches, focal weakness or numbness.   ____________________________________________   PHYSICAL EXAM:  VITAL SIGNS: ED Triage Vitals  Enc Vitals Group     BP 07/04/18 1844 129/79     Pulse Rate 07/04/18 1844 (!) 109     Resp 07/04/18 1844 20     Temp 07/04/18 1844 98.4 F (36.9 C)     Temp Source 07/04/18 1844 Oral     SpO2 07/04/18 1844 100 %     Weight 07/04/18 1842 70.3 kg (155 lb)     Height 07/04/18 1842 1.6 m (5\' 3" )     Head Circumference --      Peak Flow --      Pain Score 07/04/18 1841 5     Pain Loc --      Pain Edu? --      Excl. in GC? --     Constitutional: Alert and oriented. Well appearing and in no acute distress. Eyes: Conjunctivae are normal. PERRL. EOMI. Head: Atraumatic.Marland Kitchen. Mouth/Throat: Mucous membranes are moist.  Oropharynx non-erythematous. Neck: No stridor.   Cardiovascular: Normal rate, regular rhythm. Good  peripheral circulation. Grossly normal heart sounds. Respiratory: Normal respiratory effort.  No retractions. No audible wheezing. Gastrointestinal: Soft and nontender. No distention.  Musculoskeletal: No lower extremity tenderness nor edema. No gross deformities of extremities. Neurologic:  Normal speech and language. No gross focal neurologic deficits are appreciated.  Skin:  Skin is warm, dry and intact. No rash noted. Psychiatric: Mood and affect are normal. Speech and behavior are normal.  ____________________________________________   LABS (all labs ordered are listed, but only  abnormal results are displayed)  Labs Reviewed  BASIC METABOLIC PANEL - Abnormal; Notable for the following components:      Result Value   Potassium 3.4 (*)    CO2 21 (*)    Glucose, Bld 108 (*)    All other components within normal limits  URINALYSIS, COMPLETE (UACMP) WITH MICROSCOPIC - Abnormal; Notable for the following components:   Color, Urine YELLOW (*)    APPearance HAZY (*)    Hgb urine dipstick SMALL (*)    Ketones, ur 80 (*)    Leukocytes,Ua MODERATE (*)    All other components within normal limits  CBC  POC URINE PREG, ED   ____________________________________________  EKG  ED ECG REPORT I, [place] , the attending physician, personally viewed and interpreted this ECG.   Date: 07/04/2018  EKG Time: 6:53 PM  Rate: 99  Rhythm: Normal sinus rhythm  Axis: Normal  Intervals:Normal  ST&T Change: None     Procedures   ____________________________________________   INITIAL IMPRESSION / MDM / ASSESSMENT AND PLAN / ED COURSE  As part of my medical decision making, I reviewed the following data within the electronic MEDICAL RECORD NUMBER   38 year old female presenting with above-stated history and physical exam.  Patient states that she came to the emergency department to actually make sure that her hemoglobin was okay because she was told she needed to have it checked before having  abortion performed this week.  Patient noted to be dehydrated with 80+ ketones in her urine and as such received 2 L IV normal saline.  Patient has no complaints at present.     *MARQUITA Novak was evaluated in Emergency Department on 07/05/2018 for the symptoms described in the history of present illness. She was evaluated in the context of the global COVID-19 pandemic, which necessitated consideration that the patient might be at risk for infection with the SARS-CoV-2 virus that causes COVID-19. Institutional protocols and algorithms that pertain to the evaluation of patients at  risk for COVID-19 are in a state of rapid change based on information released by regulatory bodies including the CDC and federal and state organizations. These policies and algorithms were followed during the patient's care in the ED.  Some ED evaluations and interventions may be delayed as a result of limited staffing during the pandemic.*     ____________________________________________  FINAL CLINICAL IMPRESSION(S) / ED DIAGNOSES  Final diagnoses:  Near syncope  Dehydration     MEDICATIONS GIVEN DURING THIS VISIT:  Medications  sodium chloride 0.9 % bolus 1,000 mL (has no administration in time range)  sodium chloride 0.9 % bolus 1,000 mL (has no administration in time range)     ED Discharge Orders    None       Note:  This document was prepared using Dragon voice recognition software and may include unintentional dictation errors.   Darci Current, MD 07/05/18 [phone]
